# Patient Record
Sex: Female | Born: 1995 | Race: Black or African American | Hispanic: No | Marital: Single | Smoking: Never smoker
Health system: Southern US, Community
[De-identification: ages and names within clinical notes are randomized; demographics above are authoritative.]

## PROBLEM LIST (undated history)

## (undated) ENCOUNTER — Inpatient Hospital Stay (HOSPITAL_COMMUNITY): Payer: Self-pay

## (undated) DIAGNOSIS — K219 Gastro-esophageal reflux disease without esophagitis: Secondary | ICD-10-CM

## (undated) DIAGNOSIS — Z9109 Other allergy status, other than to drugs and biological substances: Secondary | ICD-10-CM

## (undated) DIAGNOSIS — K259 Gastric ulcer, unspecified as acute or chronic, without hemorrhage or perforation: Secondary | ICD-10-CM

## (undated) DIAGNOSIS — G43909 Migraine, unspecified, not intractable, without status migrainosus: Secondary | ICD-10-CM

## (undated) DIAGNOSIS — E860 Dehydration: Secondary | ICD-10-CM

## (undated) HISTORY — PX: NO PAST SURGERIES: SHX2092

---
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
## 2014-07-20 ENCOUNTER — Encounter (HOSPITAL_COMMUNITY): Payer: Self-pay | Admitting: Emergency Medicine

## 2014-07-20 ENCOUNTER — Emergency Department (HOSPITAL_COMMUNITY)
Admission: EM | Admit: 2014-07-20 | Discharge: 2014-07-20 | Disposition: A | Discharge status: Home / Self Care | Payer: Managed Care, Other (non HMO) | Attending: Emergency Medicine | Admitting: Emergency Medicine

## 2014-07-20 ENCOUNTER — Emergency Department (HOSPITAL_COMMUNITY): Payer: Managed Care, Other (non HMO)

## 2014-07-20 DIAGNOSIS — R1110 Vomiting, unspecified: Secondary | ICD-10-CM | POA: Insufficient documentation

## 2014-07-20 DIAGNOSIS — Z79899 Other long term (current) drug therapy: Secondary | ICD-10-CM | POA: Insufficient documentation

## 2014-07-20 DIAGNOSIS — R1013 Epigastric pain: Secondary | ICD-10-CM | POA: Diagnosis not present

## 2014-07-20 DIAGNOSIS — R109 Unspecified abdominal pain: Secondary | ICD-10-CM | POA: Diagnosis present

## 2014-07-20 DIAGNOSIS — Z3202 Encounter for pregnancy test, result negative: Secondary | ICD-10-CM | POA: Diagnosis not present

## 2014-07-20 DIAGNOSIS — N7011 Chronic salpingitis: Secondary | ICD-10-CM

## 2014-07-20 DIAGNOSIS — Z8709 Personal history of other diseases of the respiratory system: Secondary | ICD-10-CM | POA: Diagnosis not present

## 2014-07-20 LAB — COMPREHENSIVE METABOLIC PANEL
ALT: 12 U/L — AB (ref 14–54)
AST: 17 U/L (ref 15–41)
Albumin: 4.5 g/dL (ref 3.5–5.0)
Alkaline Phosphatase: 54 U/L (ref 38–126)
Anion gap: 8 (ref 5–15)
BUN: 10 mg/dL (ref 6–20)
CALCIUM: 9.2 mg/dL (ref 8.9–10.3)
CO2: 24 mmol/L (ref 22–32)
CREATININE: 0.54 mg/dL (ref 0.44–1.00)
Chloride: 106 mmol/L (ref 101–111)
GFR calc non Af Amer: >60 mL/min (ref >60)
Glucose, Bld: 121 mg/dL — ABNORMAL HIGH (ref 65–99)
POTASSIUM: 3.9 mmol/L (ref 3.5–5.1)
SODIUM: 138 mmol/L (ref 135–145)
Total Bilirubin: 0.8 mg/dL (ref 0.3–1.2)
Total Protein: 7.4 g/dL (ref 6.5–8.1)

## 2014-07-20 LAB — URINALYSIS, ROUTINE W REFLEX MICROSCOPIC
BILIRUBIN URINE: NEGATIVE
Glucose, UA: NEGATIVE mg/dL
HGB URINE DIPSTICK: NEGATIVE
Ketones, ur: NEGATIVE mg/dL
LEUKOCYTES UA: NEGATIVE
NITRITE: NEGATIVE
Protein, ur: NEGATIVE mg/dL
SPECIFIC GRAVITY, URINE: 1.023 (ref 1.005–1.030)
Urobilinogen, UA: 0.2 mg/dL (ref 0.0–1.0)
pH: 8 (ref 5.0–8.0)

## 2014-07-20 LAB — CBC WITH DIFFERENTIAL/PLATELET
Basophils Absolute: 0 10*3/uL (ref 0.0–0.1)
Basophils Relative: 0 % (ref 0–1)
Eosinophils Absolute: 0 10*3/uL (ref 0.0–0.7)
Eosinophils Relative: 0 % (ref 0–5)
HEMATOCRIT: 35.1 % — AB (ref 36.0–46.0)
Hemoglobin: 11.9 g/dL — ABNORMAL LOW (ref 12.0–15.0)
LYMPHS ABS: 1 10*3/uL (ref 0.7–4.0)
Lymphocytes Relative: 7 % — ABNORMAL LOW (ref 12–46)
MCH: 31.2 pg (ref 26.0–34.0)
MCHC: 33.9 g/dL (ref 30.0–36.0)
MCV: 92.1 fL (ref 78.0–100.0)
MONO ABS: 0.6 10*3/uL (ref 0.1–1.0)
Monocytes Relative: 4 % (ref 3–12)
Neutro Abs: 12.3 10*3/uL — ABNORMAL HIGH (ref 1.7–7.7)
Neutrophils Relative %: 89 % — ABNORMAL HIGH (ref 43–77)
PLATELETS: 240 10*3/uL (ref 150–400)
RBC: 3.81 MIL/uL — AB (ref 3.87–5.11)
RDW: 13.8 % (ref 11.5–15.5)
WBC: 13.9 10*3/uL — ABNORMAL HIGH (ref 4.0–10.5)

## 2014-07-20 LAB — LIPASE, BLOOD: Lipase: 14 U/L — ABNORMAL LOW (ref 22–51)

## 2014-07-20 LAB — PREGNANCY, URINE: Preg Test, Ur: NEGATIVE

## 2014-07-20 MED ORDER — HYDROCODONE-ACETAMINOPHEN 5-325 MG PO TABS: 1.0000 | ORAL_TABLET | Freq: Four times a day (QID) | ORAL | Status: DC | PRN

## 2014-07-20 MED ORDER — MORPHINE SULFATE 4 MG/ML IJ SOLN
4.0000 mg | Freq: Once | INTRAMUSCULAR | Status: AC
  Administered 2014-07-20: 4 mg via INTRAVENOUS
  Filled 2014-07-20: qty 1

## 2014-07-20 MED ORDER — IOHEXOL 300 MG/ML  SOLN
100.0000 mL | Freq: Once | INTRAMUSCULAR | Status: AC | PRN
  Administered 2014-07-20: 100 mL via INTRAVENOUS

## 2014-07-20 MED ORDER — IOHEXOL 300 MG/ML  SOLN
50.0000 mL | Freq: Once | INTRAMUSCULAR | Status: AC | PRN
  Administered 2014-07-20: 50 mL via ORAL

## 2014-07-20 MED ORDER — ONDANSETRON HCL 4 MG/2ML IJ SOLN
4.0000 mg | Freq: Once | INTRAMUSCULAR | Status: AC
  Administered 2014-07-20: 4 mg via INTRAVENOUS
  Filled 2014-07-20: qty 2

## 2014-07-20 MED ORDER — SODIUM CHLORIDE 0.9 % IV BOLUS (SEPSIS)
1000.0000 mL | Freq: Once | INTRAVENOUS | Status: AC
  Administered 2014-07-20: 1000 mL via INTRAVENOUS

## 2014-07-20 MED ORDER — ONDANSETRON 8 MG PO TBDP: ORAL_TABLET | ORAL | Status: DC

## 2014-07-20 NOTE — ED Notes (Signed)
Went in to patient room to remind patient that we need urine sample and to continue drinking the contrast for the CT.

## 2014-07-20 NOTE — ED Notes (Signed)
Pt c/o generalized abdominal pain and nausea/vomiting since this morning. Pt denies fever, diarrhea, urinary symptoms. A&Ox4 and ambulatory.

## 2014-07-20 NOTE — Discharge Instructions (Signed)
Hydrocodone as needed for pain. Zofran as needed for nausea.  Return to the emergency department if symptoms significantly worsen or change.   Abdominal Pain Many things can cause abdominal pain. Usually, abdominal pain is not caused by a disease and will improve without treatment. It can often be observed and treated at home. Your health care provider will do a physical exam and possibly order blood tests and X-rays to help determine the seriousness of your pain. However, in many cases, more time must pass before a clear cause of the pain can be found. Before that point, your health care provider may not know if you need more testing or further treatment. HOME CARE INSTRUCTIONS  Monitor your abdominal pain for any changes. The following actions may help to alleviate any discomfort you are experiencing:  Only take over-the-counter or prescription medicines as directed by your health care provider.  Do not take laxatives unless directed to do so by your health care provider.  Try a clear liquid diet (broth, tea, or water) as directed by your health care provider. Slowly move to a bland diet as tolerated. SEEK MEDICAL CARE IF:  You have unexplained abdominal pain.  You have abdominal pain associated with nausea or diarrhea.  You have pain when you urinate or have a bowel movement.  You experience abdominal pain that wakes you in the night.  You have abdominal pain that is worsened or improved by eating food.  You have abdominal pain that is worsened with eating fatty foods.  You have a fever. SEEK IMMEDIATE MEDICAL CARE IF:   Your pain does not go away within 2 hours.  You keep throwing up (vomiting).  Your pain is felt only in portions of the abdomen, such as the right side or the left lower portion of the abdomen.  You pass bloody or black tarry stools. MAKE SURE YOU:  Understand these instructions.   Will watch your condition.   Will get help right away if you are not  doing well or get worse.  Document Released: 11/10/2004 Document Revised: 02/05/2013 Document Reviewed: 10/10/2012 [facility] Patient Information 2015 [place], [place]. This information is not intended to replace advice given to you by your health care provider. Make sure you discuss any questions you have with your health care provider.

## 2014-07-20 NOTE — ED Provider Notes (Signed)
CSN: [id number]     Arrival date & time 07/20/14  1159 History   First MD Initiated Contact with Patient 07/20/14 1457     Chief Complaint  Patient presents with  . Emesis  . Abdominal Pain     (Consider location/radiation/quality/duration/timing/severity/associated sxs/prior Treatment) HPI Comments: Patient is a 19 year old female with no significant past medical history. She presents with complaints of upper abdominal pain, nausea, and vomiting that started early this a.m. She denies any bloody vomit or stool. She denies any diarrhea. She does report her last bowel movement was 2 hours ago and was normal. She denies any history of prior abdominal surgeries. Her last menstrual period was 3 weeks ago and was normal. She denies the possibility of pregnancy. She denies any urinary complaints.  Patient is a 19 y.o. female presenting with vomiting. The history is provided by the patient.  Emesis Severity:  Moderate Duration:  12 hours Timing:  Constant Recent urination:  Decreased Relieved by:  Nothing Worsened by:  Nothing tried Ineffective treatments:  None tried Associated symptoms: no chills and no fever     Past Medical History  Diagnosis Date  . Environmental allergies    History reviewed. No pertinent past surgical history. No family history on file. History  Substance Use Topics  . Smoking status: Never Smoker   . Smokeless tobacco: Not on file  . Alcohol Use: No   OB History    No data available     Review of Systems  Constitutional: Negative for chills.  Gastrointestinal: Positive for vomiting.  All other systems reviewed and are negative.     Allergies  Peanuts and Shrimp  Home Medications   Prior to Admission medications   Medication Sig Start Date End Date Taking? Authorizing Provider  cetirizine (ZYRTEC) 10 MG tablet Take 10 mg by mouth daily.   Yes Historical Provider, MD  loratadine (CLARITIN) 10 MG tablet Take 10 mg by mouth daily.   Yes Historical  Provider, MD  Multiple Vitamin (MULTIVITAMIN WITH MINERALS) TABS Take 1 tablet by mouth daily.   Yes Historical Provider, MD  fluticasone (FLONASE) 50 MCG/ACT nasal spray Place 2 sprays into the nose daily as needed for allergies.     Historical Provider, MD   BP 104/61 mmHg  Pulse 88  Temp(Src) 98.2 F (36.8 C) (Oral)  Resp 16  SpO2 100%  LMP 06/28/2014 Physical Exam  Constitutional: She is oriented to person, place, and time. She appears well-developed and well-nourished. No distress.  HENT:  Head: Normocephalic and atraumatic.  Neck: Normal range of motion. Neck supple.  Cardiovascular: Normal rate and regular rhythm.  Exam reveals no gallop and no friction rub.   No murmur heard. Pulmonary/Chest: Effort normal and breath sounds normal. No respiratory distress. She has no wheezes.  Abdominal: Soft. Bowel sounds are normal. She exhibits no distension. There is tenderness. There is no rebound and no guarding.  There is tenderness to palpation in the epigastric region.  Musculoskeletal: Normal range of motion.  Neurological: She is alert and oriented to person, place, and time.  Skin: Skin is warm and dry. She is not diaphoretic.  Nursing note and vitals reviewed.   ED Course  Procedures (including critical care time) Labs Review Labs Reviewed  CBC WITH DIFFERENTIAL/PLATELET - Abnormal; Notable for the following:    WBC 13.9 (*)    RBC 3.81 (*)    Hemoglobin 11.9 (*)    HCT 35.1 (*)    Neutrophils Relative % 89 (*)  Neutro Abs 12.3 (*)    Lymphocytes Relative 7 (*)    All other components within normal limits  COMPREHENSIVE METABOLIC PANEL - Abnormal; Notable for the following:    Glucose, Bld 121 (*)    ALT 12 (*)    All other components within normal limits  LIPASE, BLOOD - Abnormal; Notable for the following:    Lipase 14 (*)    All other components within normal limits  URINALYSIS, ROUTINE W REFLEX MICROSCOPIC (NOT AT [facility])  PREGNANCY, URINE    Imaging  Review No results found.   EKG Interpretation None      MDM   Final diagnoses:  None    Patient presents with severe upper abdominal pain and vomiting that started this morning. She is exquisitely tender in the epigastrium and writhing when she came in. For this reason a CT scan was obtained along with laboratory studies. She had an elevated white count, however normal LFTs and lipase. CT scan reveals no acute process but does reveal a hypo-pyosalpinx in the right adnexa. I've discussed this finding with Dr. Debroah Loop from OB/GYN who does not feel as though this is an acute process that requires intervention. I have mentioned this to the patient and told her to follow this up with her GYN. She does have an appointment this week to see them. She will be treated with pain medication, nausea medication, and when necessary return.    Geoffery Lyons, MD 07/20/14 [phone]

## 2014-10-25 ENCOUNTER — Encounter (HOSPITAL_COMMUNITY): Payer: Self-pay | Admitting: Emergency Medicine

## 2014-10-25 ENCOUNTER — Emergency Department (HOSPITAL_COMMUNITY)
Admission: EM | Admit: 2014-10-25 | Discharge: 2014-10-26 | Disposition: A | Discharge status: Home / Self Care | Payer: Managed Care, Other (non HMO) | Attending: Emergency Medicine | Admitting: Emergency Medicine

## 2014-10-25 DIAGNOSIS — R1013 Epigastric pain: Secondary | ICD-10-CM | POA: Diagnosis not present

## 2014-10-25 DIAGNOSIS — D649 Anemia, unspecified: Secondary | ICD-10-CM

## 2014-10-25 DIAGNOSIS — Z7951 Long term (current) use of inhaled steroids: Secondary | ICD-10-CM | POA: Diagnosis not present

## 2014-10-25 DIAGNOSIS — Z79899 Other long term (current) drug therapy: Secondary | ICD-10-CM | POA: Insufficient documentation

## 2014-10-25 LAB — COMPREHENSIVE METABOLIC PANEL
ALT: 16 U/L (ref 14–54)
AST: 22 U/L (ref 15–41)
Albumin: 4.4 g/dL (ref 3.5–5.0)
Alkaline Phosphatase: 54 U/L (ref 38–126)
Anion gap: 8 (ref 5–15)
BUN: 9 mg/dL (ref 6–20)
CHLORIDE: 106 mmol/L (ref 101–111)
CO2: 20 mmol/L — AB (ref 22–32)
CREATININE: 0.6 mg/dL (ref 0.44–1.00)
Calcium: 8.9 mg/dL (ref 8.9–10.3)
GFR calc Af Amer: >60 mL/min (ref >60)
GFR calc non Af Amer: >60 mL/min (ref >60)
Glucose, Bld: 117 mg/dL — ABNORMAL HIGH (ref 65–99)
Potassium: 3.7 mmol/L (ref 3.5–5.1)
Sodium: 134 mmol/L — ABNORMAL LOW (ref 135–145)
Total Bilirubin: 1.2 mg/dL (ref 0.3–1.2)
Total Protein: 7.2 g/dL (ref 6.5–8.1)

## 2014-10-25 LAB — CBC
HCT: 34.7 % — ABNORMAL LOW (ref 36.0–46.0)
HEMOGLOBIN: 11.4 g/dL — AB (ref 12.0–15.0)
MCH: 30.4 pg (ref 26.0–34.0)
MCHC: 32.9 g/dL (ref 30.0–36.0)
MCV: 92.5 fL (ref 78.0–100.0)
PLATELETS: 211 10*3/uL (ref 150–400)
RBC: 3.75 MIL/uL — AB (ref 3.87–5.11)
RDW: 13.1 % (ref 11.5–15.5)
WBC: 8.2 10*3/uL (ref 4.0–10.5)

## 2014-10-25 LAB — LIPASE, BLOOD: Lipase: 14 U/L — ABNORMAL LOW (ref 22–51)

## 2014-10-25 LAB — I-STAT BETA HCG BLOOD, ED (MC, WL, AP ONLY): I-stat hCG, quantitative: <5 m[IU]/mL (ref <5)

## 2014-10-25 MED ORDER — SODIUM CHLORIDE 0.9 % IV SOLN
1000.0000 mL | INTRAVENOUS | Status: DC
  Administered 2014-10-26: 1000 mL via INTRAVENOUS

## 2014-10-25 MED ORDER — GI COCKTAIL ~~LOC~~
30.0000 mL | Freq: Once | ORAL | Status: AC
  Administered 2014-10-26: 30 mL via ORAL
  Filled 2014-10-25: qty 30

## 2014-10-25 MED ORDER — ONDANSETRON 4 MG PO TBDP
4.0000 mg | ORAL_TABLET | Freq: Once | ORAL | Status: AC | PRN
  Administered 2014-10-25: 4 mg via ORAL
  Filled 2014-10-25: qty 1

## 2014-10-25 MED ORDER — ONDANSETRON HCL 4 MG/2ML IJ SOLN
4.0000 mg | Freq: Once | INTRAMUSCULAR | Status: AC
  Administered 2014-10-26: 4 mg via INTRAVENOUS
  Filled 2014-10-25: qty 2

## 2014-10-25 MED ORDER — PANTOPRAZOLE SODIUM 40 MG IV SOLR
40.0000 mg | Freq: Once | INTRAVENOUS | Status: AC
  Administered 2014-10-26: 40 mg via INTRAVENOUS
  Filled 2014-10-25: qty 40

## 2014-10-25 MED ORDER — SODIUM CHLORIDE 0.9 % IV SOLN
1000.0000 mL | Freq: Once | INTRAVENOUS | Status: AC
  Administered 2014-10-26: 1000 mL via INTRAVENOUS

## 2014-10-25 NOTE — ED Notes (Signed)
Pt dry heaving in triage.

## 2014-10-25 NOTE — ED Provider Notes (Signed)
CSN: [id number]     Arrival date & time 10/25/14  1916 History  This chart was scribed for Dione Booze, MD by Octavia Heir, ED Scribe. This patient was seen in room WA02/WA02 and the patient's care was started at 11:26 PM.    Chief Complaint  Patient presents with  . Abdominal Pain  . Emesis      The history is provided by the patient. No language interpreter was used.   HPI Comments: Nancy Vaughan is a 19 y.o. female who presents to the Emergency Department complaining of constant, severe, gradual worsening epigastric abdominal pain onset this morning. She rates her current pain an 8/10. Pt notes that touching her abdomen makes the pain worse and after she vomits, the pain alleviates minimally. Pt was seen in the ED about 3 months ago for the same symptoms. She reports drinking water and taking ibuprofen to alleviate the apin with no relief. Pt denies constipation, diarrhea, hematemesis, mucus in vomit, fever, chills, break out in sweats, and being around sick contacts. Pt is a non-smoker and does not consume etoh.   Past Medical History  Diagnosis Date  . Environmental allergies    History reviewed. No pertinent past surgical history. No family history on file. Social History  Substance Use Topics  . Smoking status: Never Smoker   . Smokeless tobacco: None  . Alcohol Use: No   OB History    No data available     Review of Systems  Constitutional: Negative for fever and chills.  Gastrointestinal: Positive for vomiting and abdominal pain. Negative for diarrhea.  All other systems reviewed and are negative.     Allergies  Peanuts and Shrimp  Home Medications   Prior to Admission medications   Medication Sig Start Date End Date Taking? Authorizing Provider  cetirizine (ZYRTEC) 10 MG tablet Take 10 mg by mouth daily.    Historical Provider, MD  fluticasone (FLONASE) 50 MCG/ACT nasal spray Place 2 sprays into the nose daily as needed for allergies.     Historical Provider,  MD  HYDROcodone-acetaminophen (NORCO) 5-325 MG per tablet Take 1-2 tablets by mouth every 6 (six) hours as needed. 07/20/14   Geoffery Lyons, MD  loratadine (CLARITIN) 10 MG tablet Take 10 mg by mouth daily.    Historical Provider, MD  Multiple Vitamin (MULTIVITAMIN WITH MINERALS) TABS Take 1 tablet by mouth daily.    Historical Provider, MD  ondansetron (ZOFRAN ODT) 8 MG disintegrating tablet  ODT q4 hours prn nausea 07/20/14   Geoffery Lyons, MD   Triage vitals: BP 97/58 mmHg  Pulse 81  Temp(Src) 98.4 F (36.9 C) (Oral)  Resp 18  SpO2 100%  LMP  Physical Exam  Constitutional: She is oriented to person, place, and time. She appears well-developed and well-nourished. No distress.  HENT:  Head: Normocephalic and atraumatic.  Eyes: EOM are normal. Pupils are equal, round, and reactive to light.  Neck: Normal range of motion. Neck supple. No JVD present.  Cardiovascular: Normal rate, regular rhythm and normal heart sounds.   No murmur heard. Pulmonary/Chest: Effort normal and breath sounds normal. She has no wheezes. She has no rales. She exhibits no tenderness.  Abdominal: Soft. She exhibits no distension and no mass. There is tenderness. There is no rebound and no guarding.  Moderate epigastric tenderness, bowel sounds decreased  Musculoskeletal: Normal range of motion. She exhibits no edema.  Lymphadenopathy:    She has no cervical adenopathy.  Neurological: She is alert and oriented to person,  place, and time. No cranial nerve deficit. She exhibits normal muscle tone. Coordination normal.  Skin: Skin is warm and dry. No rash noted.  Psychiatric: She has a normal mood and affect. Her behavior is normal. Judgment and thought content normal.  Nursing note and vitals reviewed.   ED Course  Procedures  DIAGNOSTIC STUDIES: Oxygen Saturation is 100% on RA, normal by my interpretation.  COORDINATION OF CARE:  11:29 PM Discussed treatment plan which includes Zofran, Protonix, and GI  cocktail with pt at bedside and pt agreed to plan.  Labs Review Results for orders placed or performed during the hospital encounter of 10/25/14  Lipase, blood  Result Value Ref Range   Lipase 14 (L) 22 - 51 U/L  Comprehensive metabolic panel  Result Value Ref Range   Sodium 134 (L) 135 - 145 mmol/L   Potassium 3.7 3.5 - 5.1 mmol/L   Chloride 106 101 - 111 mmol/L   CO2 20 (L) 22 - 32 mmol/L   Glucose, Bld 117 (H) 65 - 99 mg/dL   BUN 9 6 - 20 mg/dL   Creatinine, Ser 1.61 0.44 - 1.00 mg/dL   Calcium 8.9 8.9 - 09.6 mg/dL   Total Protein 7.2 6.5 - 8.1 g/dL   Albumin 4.4 3.5 - 5.0 g/dL   AST 22 15 - 41 U/L   ALT 16 14 - 54 U/L   Alkaline Phosphatase 54 38 - 126 U/L   Total Bilirubin 1.2 0.3 - 1.2 mg/dL   GFR calc non Af Amer >60 >60 mL/min   GFR calc Af Amer >60 >60 mL/min   Anion gap 8 5 - 15  CBC  Result Value Ref Range   WBC 8.2 4.0 - 10.5 K/uL   RBC 3.75 (L) 3.87 - 5.11 MIL/uL   Hemoglobin 11.4 (L) 12.0 - 15.0 g/dL   HCT 04.5 (L) 40.9 - 81.1 %   MCV 92.5 78.0 - 100.0 fL   MCH 30.4 26.0 - 34.0 pg   MCHC 32.9 30.0 - 36.0 g/dL   RDW 91.4 78.2 - 95.6 %   Platelets 211 150 - 400 K/uL  Urinalysis, Routine w reflex microscopic (not at [facility])  Result Value Ref Range   Color, Urine YELLOW YELLOW   APPearance TURBID (A) CLEAR   Specific Gravity, Urine 1.028 1.005 - 1.030   pH 8.5 (H) 5.0 - 8.0   Glucose, UA NEGATIVE NEGATIVE mg/dL   Hgb urine dipstick NEGATIVE NEGATIVE   Bilirubin Urine NEGATIVE NEGATIVE   Ketones, ur 15 (A) NEGATIVE mg/dL   Protein, ur 30 (A) NEGATIVE mg/dL   Urobilinogen, UA 0.2 0.0 - 1.0 mg/dL   Nitrite NEGATIVE NEGATIVE   Leukocytes, UA NEGATIVE NEGATIVE  Urine microscopic-add on  Result Value Ref Range   Squamous Epithelial / LPF RARE RARE   Bacteria, UA FEW (A) RARE   Urine-Other AMORPHOUS URATES/PHOSPHATES   I-Stat beta hCG blood, ED ([facility], [facility], [facility] only)  Result Value Ref Range   I-stat hCG, quantitative <5.0 <5 mIU/mL   Comment 3           I have  personally reviewed and evaluated these lab results as part of my medical decision-making.   MDM   Final diagnoses:  Epigastric pain  Normochromic normocytic anemia    Epigastric pain worrisome for possible ulcer disease. Nausea had been controlled with ondansetron. She is given IV fluids, IV pantoprazole, and he had GI cocktail. She had excellent relief of symptoms with this. Old records were reviewed  and she had an ED visit in June with similar symptoms. CT at that time showed probable hydro-pyosalpinx but was asymptomatic and she was instructed to follow-up with OB/GYN. She is discharged with prescription for pantoprazole and also for ondansetron. She is also noted to have slightly elevated blood sugar. She is advised to have this checked periodically since it may represent prediabetes.  I personally performed the services described in this documentation, which was scribed in my presence. The recorded information has been reviewed and is accurate.     Dione Booze, MD 10/26/14 [phone]

## 2014-10-25 NOTE — ED Notes (Signed)
Pt reports abdominal pain since this a.m and vomiting that started about 30 minutes ago.  Pt nauseated in triage.

## 2014-10-25 NOTE — ED Notes (Signed)
Requested patient to urinate. 

## 2014-10-26 DIAGNOSIS — R1013 Epigastric pain: Secondary | ICD-10-CM | POA: Diagnosis not present

## 2014-10-26 LAB — URINALYSIS, ROUTINE W REFLEX MICROSCOPIC
BILIRUBIN URINE: NEGATIVE
Glucose, UA: NEGATIVE mg/dL
Hgb urine dipstick: NEGATIVE
Ketones, ur: 15 mg/dL — AB
LEUKOCYTES UA: NEGATIVE
NITRITE: NEGATIVE
PROTEIN: 30 mg/dL — AB
Specific Gravity, Urine: 1.028 (ref 1.005–1.030)
UROBILINOGEN UA: 0.2 mg/dL (ref 0.0–1.0)
pH: 8.5 — ABNORMAL HIGH (ref 5.0–8.0)

## 2014-10-26 LAB — URINE MICROSCOPIC-ADD ON

## 2014-10-26 MED ORDER — ONDANSETRON HCL 4 MG PO TABS: 4.0000 mg | ORAL_TABLET | Freq: Four times a day (QID) | ORAL | Status: DC | PRN

## 2014-10-26 MED ORDER — PANTOPRAZOLE SODIUM 40 MG PO TBEC: 40.0000 mg | DELAYED_RELEASE_TABLET | Freq: Every day | ORAL | Status: DC

## 2014-10-26 NOTE — Discharge Instructions (Signed)
I am concerned that your pain may be from an ulcer. Please follow-up with your doctor for further evaluation.  Your blood sugar is slightly elevated. This may be what is called prediabetes. This means that your blood sugar should be checked periodically. You do not need any special treatment are diet at this point. However, if your blood sugar starts getting higher, he may need medication for it.   Abdominal Pain Many things can cause abdominal pain. Usually, abdominal pain is not caused by a disease and will improve without treatment. It can often be observed and treated at home. Your health care provider will do a physical exam and possibly order blood tests and X-rays to help determine the seriousness of your pain. However, in many cases, more time must pass before a clear cause of the pain can be found. Before that point, your health care provider may not know if you need more testing or further treatment. HOME CARE INSTRUCTIONS  Monitor your abdominal pain for any changes. The following actions may help to alleviate any discomfort you are experiencing:  Only take over-the-counter or prescription medicines as directed by your health care provider.  Do not take laxatives unless directed to do so by your health care provider.  Try a clear liquid diet (broth, tea, or water) as directed by your health care provider. Slowly move to a bland diet as tolerated. SEEK MEDICAL CARE IF:  You have unexplained abdominal pain.  You have abdominal pain associated with nausea or diarrhea.  You have pain when you urinate or have a bowel movement.  You experience abdominal pain that wakes you in the night.  You have abdominal pain that is worsened or improved by eating food.  You have abdominal pain that is worsened with eating fatty foods.  You have a fever. SEEK IMMEDIATE MEDICAL CARE IF:   Your pain does not go away within 2 hours.  You keep throwing up (vomiting).  Your pain is felt only in  portions of the abdomen, such as the right side or the left lower portion of the abdomen.  You pass bloody or black tarry stools. MAKE SURE YOU:  Understand these instructions.   Will watch your condition.   Will get help right away if you are not doing well or get worse.  Document Released: 11/10/2004 Document Revised: 02/05/2013 Document Reviewed: 10/10/2012 [facility] Patient Information 2015 [place], [place]. This information is not intended to replace advice given to you by your health care provider. Make sure you discuss any questions you have with your health care provider.  Peptic Ulcer A peptic ulcer is a sore in the lining of your esophagus (esophageal ulcer), stomach (gastric ulcer), or in the first part of your small intestine (duodenal ulcer). The ulcer causes erosion into the deeper tissue. CAUSES  Normally, the lining of the stomach and the small intestine protects itself from the acid that digests food. The protective lining can be damaged by:  An infection caused by a bacterium called Helicobacter pylori (H. pylori).  Regular use of nonsteroidal anti-inflammatory drugs (NSAIDs), such as ibuprofen or aspirin.  Smoking tobacco. Other risk factors include being older than 50, drinking alcohol excessively, and having a family history of ulcer disease.  SYMPTOMS   Burning pain or gnawing in the area between the chest and the belly button.  Heartburn.  Nausea and vomiting.  Bloating. The pain can be worse on an empty stomach and at night. If the ulcer results in bleeding, it can  cause:  Black, tarry stools.  Vomiting of bright red blood.  Vomiting of coffee-ground-looking materials. DIAGNOSIS  A diagnosis is usually made based upon your history and an exam. Other tests and procedures may be performed to find the cause of the ulcer. Finding a cause will help determine the best treatment. Tests and procedures may include:  Blood tests, stool tests, or breath tests  to check for the bacterium H. pylori.  An upper gastrointestinal (GI) series of the esophagus, stomach, and small intestine.  An endoscopy to examine the esophagus, stomach, and small intestine.  A biopsy. TREATMENT  Treatment may include:  Eliminating the cause of the ulcer, such as smoking, NSAIDs, or alcohol.  Medicines to reduce the amount of acid in your digestive tract.  Antibiotic medicines if the ulcer is caused by the H. pylori bacterium.  An upper endoscopy to treat a bleeding ulcer.  Surgery if the bleeding is severe or if the ulcer created a hole somewhere in the digestive system. HOME CARE INSTRUCTIONS   Avoid tobacco, alcohol, and caffeine. Smoking can increase the acid in the stomach, and continued smoking will impair the healing of ulcers.  Avoid foods and drinks that seem to cause discomfort or aggravate your ulcer.  Only take medicines as directed by your caregiver. Do not substitute over-the-counter medicines for prescription medicines without talking to your caregiver.  Keep any follow-up appointments and tests as directed. SEEK MEDICAL CARE IF:   Your do not improve within 7 days of starting treatment.  You have ongoing indigestion or heartburn. SEEK IMMEDIATE MEDICAL CARE IF:   You have sudden, sharp, or persistent abdominal pain.  You have bloody or dark black, tarry stools.  You vomit blood or vomit that looks like coffee grounds.  You become light-headed, weak, or feel faint.  You become sweaty or clammy. MAKE SURE YOU:   Understand these instructions.  Will watch your condition.  Will get help right away if you are not doing well or get worse. Document Released: 01/29/2000 Document Revised: 06/17/2013 Document Reviewed: 08/31/2011 [facility] Patient Information 2015 [place], [place]. This information is not intended to replace advice given to you by your health care provider. Make sure you discuss any questions you have with your health care  provider.  Pantoprazole tablets What is this medicine? PANTOPRAZOLE (pan TOE pra zole) prevents the production of acid in the stomach. It is used to treat gastroesophageal reflux disease (GERD), inflammation of the esophagus, and Zollinger-Ellison syndrome. This medicine may be used for other purposes; ask your health care provider or pharmacist if you have questions. COMMON BRAND NAME(S): Protonix What should I tell my health care provider before I take this medicine? They need to know if you have any of these conditions: -liver disease -low levels of magnesium in the blood -an unusual or allergic reaction to omeprazole, lansoprazole, pantoprazole, rabeprazole, other medicines, foods, dyes, or preservatives -pregnant or trying to get pregnant -breast-feeding How should I use this medicine? Take this medicine by mouth. Swallow the tablets whole with a drink of water. Follow the directions on the prescription label. Do not crush, break, or chew. Take your medicine at regular intervals. Do not take your medicine more often than directed. Talk to your pediatrician regarding the use of this medicine in children. While this drug may be prescribed for children as young as 5 years for selected conditions, precautions do apply. Overdosage: If you think you have taken too much of this medicine contact a poison control center  or emergency room at once. NOTE: This medicine is only for you. Do not share this medicine with others. What if I miss a dose? If you miss a dose, take it as soon as you can. If it is almost time for your next dose, take only that dose. Do not take double or extra doses. What may interact with this medicine? Do not take this medicine with any of the following medications: -atazanavir -nelfinavir This medicine may also interact with the following medications: -ampicillin -delavirdine -digoxin -diuretics -iron salts -medicines for fungal infections like ketoconazole,  itraconazole and voriconazole -warfarin This list may not describe all possible interactions. Give your health care provider a list of all the medicines, herbs, non-prescription drugs, or dietary supplements you use. Also tell them if you smoke, drink alcohol, or use illegal drugs. Some items may interact with your medicine. What should I watch for while using this medicine? It can take several days before your stomach pain gets better. Check with your doctor or health care professional if your condition does not start to get better, or if it gets worse. You may need blood work done while you are taking this medicine. What side effects may I notice from receiving this medicine? Side effects that you should report to your doctor or health care professional as soon as possible: -allergic reactions like skin rash, itching or hives, swelling of the face, lips, or tongue -bone, muscle or joint pain -breathing problems -chest pain or chest tightness -dark yellow or brown urine -dizziness -fast, irregular heartbeat -feeling faint or lightheaded -fever or sore throat -muscle spasm -palpitations -redness, blistering, peeling or loosening of the skin, including inside the mouth -seizures -tremors -unusual bleeding or bruising -unusually weak or tired -yellowing of the eyes or skin Side effects that usually do not require medical attention (Report these to your doctor or health care professional if they continue or are bothersome.): -constipation -diarrhea -dry mouth -headache -nausea This list may not describe all possible side effects. Call your doctor for medical advice about side effects. You may report side effects to FDA at 1-800-FDA-1088. Where should I keep my medicine? Keep out of the reach of children. Store at room temperature between 15 and 30 degrees C (59 and 86 degrees F). Protect from light and moisture. Throw away any unused medicine after the expiration date. NOTE: This  sheet is a summary. It may not cover all possible information. If you have questions about this medicine, talk to your doctor, pharmacist, or health care provider.  2015, [employer]. (2011-11-30 16:40:16)  Ondansetron tablets What is this medicine? ONDANSETRON (on DAN se tron) is used to treat nausea and vomiting caused by chemotherapy. It is also used to prevent or treat nausea and vomiting after surgery. This medicine may be used for other purposes; ask your health care provider or pharmacist if you have questions. COMMON BRAND NAME(S): Zofran What should I tell my health care provider before I take this medicine? They need to know if you have any of these conditions: -heart disease -history of irregular heartbeat -liver disease -low levels of magnesium or potassium in the blood -an unusual or allergic reaction to ondansetron, granisetron, other medicines, foods, dyes, or preservatives -pregnant or trying to get pregnant -breast-feeding How should I use this medicine? Take this medicine by mouth with a glass of water. Follow the directions on your prescription label. Take your doses at regular intervals. Do not take your medicine more often than directed. Talk to your  pediatrician regarding the use of this medicine in children. Special care may be needed. Overdosage: If you think you have taken too much of this medicine contact a poison control center or emergency room at once. NOTE: This medicine is only for you. Do not share this medicine with others. What if I miss a dose? If you miss a dose, take it as soon as you can. If it is almost time for your next dose, take only that dose. Do not take double or extra doses. What may interact with this medicine? Do not take this medicine with any of the following medications: -apomorphine -certain medicines for fungal infections like fluconazole, itraconazole, ketoconazole, posaconazole,  voriconazole -cisapride -dofetilide -dronedarone -pimozide -thioridazine -ziprasidone This medicine may also interact with the following medications: -carbamazepine -certain medicines for depression, anxiety, or psychotic disturbances -fentanyl -linezolid -MAOIs like Carbex, Eldepryl, Marplan, Nardil, and Parnate -methylene blue (injected into a vein) -other medicines that prolong the QT interval (cause an abnormal heart rhythm) -phenytoin -rifampicin -tramadol This list may not describe all possible interactions. Give your health care provider a list of all the medicines, herbs, non-prescription drugs, or dietary supplements you use. Also tell them if you smoke, drink alcohol, or use illegal drugs. Some items may interact with your medicine. What should I watch for while using this medicine? Check with your doctor or health care professional right away if you have any sign of an allergic reaction. What side effects may I notice from receiving this medicine? Side effects that you should report to your doctor or health care professional as soon as possible: -allergic reactions like skin rash, itching or hives, swelling of the face, lips or tongue -breathing problems -confusion -dizziness -fast or irregular heartbeat -feeling faint or lightheaded, falls -fever and chills -loss of balance or coordination -seizures -sweating -swelling of the hands or feet -tightness in the chest -tremors -unusually weak or tired Side effects that usually do not require medical attention (report to your doctor or health care professional if they continue or are bothersome): -constipation or diarrhea -headache This list may not describe all possible side effects. Call your doctor for medical advice about side effects. You may report side effects to FDA at 1-800-FDA-1088. Where should I keep my medicine? Keep out of the reach of children. Store between 2 and 30 degrees C (36 and 86 degrees F).  Throw away any unused medicine after the expiration date. NOTE: This sheet is a summary. It may not cover all possible information. If you have questions about this medicine, talk to your doctor, pharmacist, or health care provider.  2015, [employer] Standard. (2012-11-07 16:27:45)

## 2015-04-08 ENCOUNTER — Ambulatory Visit
Admission: RE | Admit: 2015-04-08 | Discharge: 2015-04-08 | Disposition: A | Discharge status: Home / Self Care | Payer: Managed Care, Other (non HMO) | Source: Ambulatory Visit | Attending: Cardiovascular Disease | Admitting: Cardiovascular Disease

## 2015-04-08 ENCOUNTER — Other Ambulatory Visit: Payer: Self-pay | Admitting: Cardiovascular Disease

## 2015-04-08 DIAGNOSIS — M25572 Pain in left ankle and joints of left foot: Secondary | ICD-10-CM

## 2016-01-28 ENCOUNTER — Emergency Department (HOSPITAL_COMMUNITY)
Admission: EM | Admit: 2016-01-28 | Discharge: 2016-01-28 | Disposition: A | Discharge status: Home / Self Care | Payer: Managed Care, Other (non HMO) | Source: Home / Self Care

## 2016-01-28 ENCOUNTER — Encounter (HOSPITAL_COMMUNITY): Payer: Self-pay | Admitting: General Practice

## 2016-01-28 ENCOUNTER — Observation Stay (HOSPITAL_COMMUNITY): Payer: Managed Care, Other (non HMO)

## 2016-01-28 ENCOUNTER — Observation Stay (HOSPITAL_COMMUNITY)
Admission: AD | Admit: 2016-01-28 | Discharge: 2016-01-31 | Disposition: A | Discharge status: Home / Self Care | Payer: Managed Care, Other (non HMO) | Source: Ambulatory Visit | Attending: Cardiovascular Disease | Admitting: Cardiovascular Disease

## 2016-01-28 DIAGNOSIS — R1013 Epigastric pain: Secondary | ICD-10-CM | POA: Diagnosis present

## 2016-01-28 DIAGNOSIS — R109 Unspecified abdominal pain: Secondary | ICD-10-CM | POA: Insufficient documentation

## 2016-01-28 DIAGNOSIS — Z91013 Allergy to seafood: Secondary | ICD-10-CM | POA: Diagnosis not present

## 2016-01-28 DIAGNOSIS — K279 Peptic ulcer, site unspecified, unspecified as acute or chronic, without hemorrhage or perforation: Secondary | ICD-10-CM | POA: Insufficient documentation

## 2016-01-28 DIAGNOSIS — Z5321 Procedure and treatment not carried out due to patient leaving prior to being seen by health care provider: Secondary | ICD-10-CM

## 2016-01-28 DIAGNOSIS — G43A0 Cyclical vomiting, not intractable: Secondary | ICD-10-CM | POA: Insufficient documentation

## 2016-01-28 DIAGNOSIS — E860 Dehydration: Principal | ICD-10-CM

## 2016-01-28 DIAGNOSIS — K208 Other esophagitis: Secondary | ICD-10-CM | POA: Insufficient documentation

## 2016-01-28 DIAGNOSIS — K2950 Unspecified chronic gastritis without bleeding: Secondary | ICD-10-CM | POA: Diagnosis not present

## 2016-01-28 DIAGNOSIS — Z8719 Personal history of other diseases of the digestive system: Secondary | ICD-10-CM | POA: Diagnosis not present

## 2016-01-28 DIAGNOSIS — Z91018 Allergy to other foods: Secondary | ICD-10-CM | POA: Insufficient documentation

## 2016-01-28 DIAGNOSIS — K219 Gastro-esophageal reflux disease without esophagitis: Secondary | ICD-10-CM | POA: Insufficient documentation

## 2016-01-28 DIAGNOSIS — Z91010 Allergy to peanuts: Secondary | ICD-10-CM | POA: Insufficient documentation

## 2016-01-28 DIAGNOSIS — Z888 Allergy status to other drugs, medicaments and biological substances status: Secondary | ICD-10-CM | POA: Insufficient documentation

## 2016-01-28 DIAGNOSIS — F419 Anxiety disorder, unspecified: Secondary | ICD-10-CM | POA: Insufficient documentation

## 2016-01-28 DIAGNOSIS — R1115 Cyclical vomiting syndrome unrelated to migraine: Secondary | ICD-10-CM

## 2016-01-28 DIAGNOSIS — R1012 Left upper quadrant pain: Secondary | ICD-10-CM

## 2016-01-28 HISTORY — DX: Migraine, unspecified, not intractable, without status migrainosus: G43.909

## 2016-01-28 HISTORY — DX: Gastro-esophageal reflux disease without esophagitis: K21.9

## 2016-01-28 HISTORY — DX: Gastric ulcer, unspecified as acute or chronic, without hemorrhage or perforation: K25.9

## 2016-01-28 HISTORY — DX: Dehydration: E86.0

## 2016-01-28 LAB — CBC WITH DIFFERENTIAL/PLATELET
BASOS ABS: 0 10*3/uL (ref 0.0–0.1)
Basophils Relative: 0 %
Eosinophils Absolute: 0 10*3/uL (ref 0.0–0.7)
Eosinophils Relative: 0 %
HEMATOCRIT: 34.3 % — AB (ref 36.0–46.0)
Hemoglobin: 11.9 g/dL — ABNORMAL LOW (ref 12.0–15.0)
LYMPHS ABS: 0.7 10*3/uL (ref 0.7–4.0)
LYMPHS PCT: 8 %
MCH: 31.6 pg (ref 26.0–34.0)
MCHC: 34.7 g/dL (ref 30.0–36.0)
MCV: 91.2 fL (ref 78.0–100.0)
Monocytes Absolute: 0.4 10*3/uL (ref 0.1–1.0)
Monocytes Relative: 5 %
NEUTROS ABS: 7.7 10*3/uL (ref 1.7–7.7)
Neutrophils Relative %: 87 %
Platelets: 203 10*3/uL (ref 150–400)
RBC: 3.76 MIL/uL — AB (ref 3.87–5.11)
RDW: 13.3 % (ref 11.5–15.5)
WBC: 8.9 10*3/uL (ref 4.0–10.5)

## 2016-01-28 LAB — COMPREHENSIVE METABOLIC PANEL
ALBUMIN: 4.1 g/dL (ref 3.5–5.0)
ALT: 13 U/L — ABNORMAL LOW (ref 14–54)
ANION GAP: 8 (ref 5–15)
AST: 20 U/L (ref 15–41)
Alkaline Phosphatase: 48 U/L (ref 38–126)
BILIRUBIN TOTAL: 1 mg/dL (ref 0.3–1.2)
BUN: 8 mg/dL (ref 6–20)
CHLORIDE: 109 mmol/L (ref 101–111)
CO2: 20 mmol/L — ABNORMAL LOW (ref 22–32)
Calcium: 9.1 mg/dL (ref 8.9–10.3)
Creatinine, Ser: 0.76 mg/dL (ref 0.44–1.00)
GFR calc Af Amer: >60 mL/min (ref >60)
GLUCOSE: 94 mg/dL (ref 65–99)
POTASSIUM: 3.7 mmol/L (ref 3.5–5.1)
Sodium: 137 mmol/L (ref 135–145)
TOTAL PROTEIN: 6.8 g/dL (ref 6.5–8.1)

## 2016-01-28 LAB — LIPASE, BLOOD: Lipase: 15 U/L (ref 11–51)

## 2016-01-28 LAB — AMYLASE: Amylase: 59 U/L (ref 28–100)

## 2016-01-28 LAB — PREGNANCY, URINE: PREG TEST UR: NEGATIVE

## 2016-01-28 MED ORDER — ENOXAPARIN SODIUM 40 MG/0.4ML ~~LOC~~ SOLN
40.0000 mg | SUBCUTANEOUS | Status: DC
  Filled 2016-01-28 (×2): qty 0.4

## 2016-01-28 MED ORDER — DEXTROSE-NACL 5-0.9 % IV SOLN
INTRAVENOUS | Status: DC
  Administered 2016-01-28 – 2016-01-31 (×6): via INTRAVENOUS

## 2016-01-28 MED ORDER — ONDANSETRON HCL 4 MG/2ML IJ SOLN
4.0000 mg | Freq: Once | INTRAMUSCULAR | Status: AC
  Administered 2016-01-28: 4 mg via INTRAVENOUS
  Filled 2016-01-28: qty 2

## 2016-01-28 MED ORDER — HYDROMORPHONE HCL 2 MG/ML IJ SOLN
0.5000 mg | Freq: Once | INTRAMUSCULAR | Status: AC
  Administered 2016-01-28: 0.5 mg via INTRAVENOUS
  Filled 2016-01-28: qty 1

## 2016-01-28 MED ORDER — ONDANSETRON HCL 4 MG/2ML IJ SOLN
4.0000 mg | Freq: Four times a day (QID) | INTRAMUSCULAR | Status: DC | PRN
  Administered 2016-01-29 (×2): 4 mg via INTRAVENOUS
  Filled 2016-01-28 (×2): qty 2

## 2016-01-28 MED ORDER — PANTOPRAZOLE SODIUM 40 MG IV SOLR
40.0000 mg | INTRAVENOUS | Status: DC
  Administered 2016-01-28 – 2016-01-30 (×3): 40 mg via INTRAVENOUS
  Filled 2016-01-28 (×3): qty 40

## 2016-01-28 NOTE — ED Triage Notes (Signed)
Called for triage x 3 all parts of lobby.

## 2016-01-28 NOTE — H&P (Signed)
Referring Physician:  Oneal GroutJade Vaughan is an 20 y.o. female.                       Chief Complaint: Nausea, vomiting and weakness with abdominal pain.  HPI: 20 year old female has 2 day history of abdominal pain, nausea, vomiting and weakness. She is unable to keep any medication or food down and unable to stand up without support. She has history of gastric ulcer and has flare up of gastritis off and on.   Past Medical History:  Diagnosis Date  . Environmental allergies       No past surgical history on file.  No family history on file. Social History:  reports that she has never smoked. She does not have any smokeless tobacco history on file. She reports that she does not drink alcohol or use drugs.  Allergies:  Allergies  Allergen Reactions  . Peanuts [Peanut Oil] Itching and Other (See Comments)    Itchy throat and sneezing   . Shrimp [Shellfish Allergy] Swelling and Other (See Comments)    Congestion.    Medications Prior to Admission  Medication Sig Dispense Refill  . EPIPEN 2-PAK 0.3 MG/0.3ML SOAJ injection Inject 1 application into the skin as needed (for allergic reaciton).   1  . fluticasone (FLONASE) 50 MCG/ACT nasal spray Place 2 sprays into the nose daily as needed for allergies.     Marland Kitchen. ibuprofen (ADVIL,MOTRIN) 200 MG tablet Take 400 mg by mouth every 6 (six) hours as needed for fever, headache, mild pain, moderate pain or cramping.    . loratadine (CLARITIN) 10 MG tablet Take 10 mg by mouth daily.    . ondansetron (ZOFRAN) 4 MG tablet Take 1 tablet (4 mg total) by mouth every 6 (six) hours as needed for nausea or vomiting. 12 tablet 0  . pantoprazole (PROTONIX) 40 MG tablet Take 1 tablet (40 mg total) by mouth daily. 30 tablet 0    No results found for this or any previous visit (from the past 48 hour(Vaughan)). No results found.  Review Of Systems Constitutional: No fever, chills , weight loss or gain. Eyes: No vision change, No discharge or pain.. Ears: No hearing  loss, No tinnitus. Respiratory: No asthma, COPD, pneumonias or shortness of breath. No hemoptysis. Cardiovascular: No chest pain, palpitation or leg edema. Gastrointestinal: Positive nausea, vomiting. No diarrhea or constipation. No GI bleed. No hepatitis. Positive gastritis. Genitourinary: No dysuria, hematuria or kidney stone. No incontinance. Neurological: Occasional headache, no stroke or seizures.  Psychiatry: No psych facility admission for anxiety, depression or suicide. No detox. Skin: No rash. Musculoskeletal: No joint pain or fibromyalgia. No neck pain or back pain. Lymphadenopathy: No lymphadenopathy. Hematology: No anemia or easy bruising.   Blood pressure (!) 91/59, pulse 96, temperature 98.3 F (36.8 C), temperature source Oral, resp. rate 16, height 5\' 3"  (1.6 m), weight 50.1 kg (110 lb 7.2 oz), SpO2 90 %. Body mass index is 19.57 kg/m. General appearance: alert, cooperative, appears stated age and moderate distress Head: Normocephalic, atraumatic. Eyes: conjunctivae/corneas clear. PERRL, EOM'Vaughan intact.  Neck: No adenopathy, no carotid bruit, no JVD, supple, symmetrical, trachea midline and thyroid not enlarged, symmetric, no tenderness/mass/nodules Resp: Clear to auscultation bilaterally. Cardio: Regular rate and rhythm, S1, S2 normal, II/VI systolic murmur, no click, rub or gallop GI: soft, epigastic-tenderness; bowel sounds normal; no masses,  no organomegaly. Extremities: extremities normal, atraumatic, no cyanosis or edema Skin: Warm and dry. No rashes or lesions Neurologic: Alert  and oriented X 3, normal strength and tone.   Assessment/Plan Acute dehydration Acute abdominal pain H/O Gastirits R/O pancreatitis  Place in observation IV fluids/blood work.  Nancy RodriguezKADAKIA,Nancy Dower S, MD  01/28/2016, 6:07 PM

## 2016-01-28 NOTE — ED Notes (Signed)
Patient in lobby states this patient received a phone call from primary care doctor stating that they could see patient shortly and left.

## 2016-01-29 DIAGNOSIS — R1012 Left upper quadrant pain: Secondary | ICD-10-CM | POA: Diagnosis not present

## 2016-01-29 DIAGNOSIS — E860 Dehydration: Secondary | ICD-10-CM

## 2016-01-29 LAB — CBC
HEMATOCRIT: 33.7 % — AB (ref 36.0–46.0)
HEMOGLOBIN: 11.1 g/dL — AB (ref 12.0–15.0)
MCH: 30 pg (ref 26.0–34.0)
MCHC: 32.9 g/dL (ref 30.0–36.0)
MCV: 91.1 fL (ref 78.0–100.0)
Platelets: 206 10*3/uL (ref 150–400)
RBC: 3.7 MIL/uL — ABNORMAL LOW (ref 3.87–5.11)
RDW: 13.4 % (ref 11.5–15.5)
WBC: 12 10*3/uL — AB (ref 4.0–10.5)

## 2016-01-29 LAB — BASIC METABOLIC PANEL
ANION GAP: 6 (ref 5–15)
BUN: 7 mg/dL (ref 6–20)
CALCIUM: 8.6 mg/dL — AB (ref 8.9–10.3)
CO2: 24 mmol/L (ref 22–32)
Chloride: 108 mmol/L (ref 101–111)
Creatinine, Ser: 0.75 mg/dL (ref 0.44–1.00)
GFR calc Af Amer: >60 mL/min (ref >60)
GLUCOSE: 99 mg/dL (ref 65–99)
POTASSIUM: 3.6 mmol/L (ref 3.5–5.1)
SODIUM: 138 mmol/L (ref 135–145)

## 2016-01-29 MED ORDER — DIPHENHYDRAMINE HCL 25 MG PO CAPS
25.0000 mg | ORAL_CAPSULE | Freq: Once | ORAL | Status: AC
  Administered 2016-01-29: 25 mg via ORAL
  Filled 2016-01-29: qty 1

## 2016-01-29 MED ORDER — HYDROMORPHONE HCL 2 MG/ML IJ SOLN
0.5000 mg | Freq: Once | INTRAMUSCULAR | Status: AC
  Administered 2016-01-29: 0.5 mg via INTRAVENOUS
  Filled 2016-01-29: qty 1

## 2016-01-29 MED ORDER — OXYCODONE-ACETAMINOPHEN 5-325 MG PO TABS
1.0000 | ORAL_TABLET | Freq: Four times a day (QID) | ORAL | Status: DC | PRN
  Administered 2016-01-29 – 2016-01-31 (×5): 1 via ORAL
  Filled 2016-01-29 (×5): qty 1

## 2016-01-29 NOTE — Consult Note (Signed)
[facility] Consult: 9:10 AM 01/29/2016  LOS: 0 days    Referring Provider:  Dr Algie Coffer  Primary Care Physician:  Ricki Rodriguez, MD Primary Gastroenterologist:  Gentry Fitz.     Reason for Consultation:  Abdominal pain.    HPI: Nancy Vaughan is a 20 y.o. female.  PMH Migraines.  2 visits to ED in 07/2014 and 10/2014 with similar to current sxs of upper abdominal nonradiating pain, nausea and vomiting. 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  She was given pain meds and antinauseal. At the second visit she was treated with hydrocodone, anti-emetics and pantoprazole.  She took the pantoprazole for about a year.  She has never undergone upper endoscopy.  Her mother has some sort of history of what family believes is peptic ulcer disease.  Generally the patient does not have issues with her appetite, with reflux disease or with chronic GI issues. Yesterday morning she had recurrence of pain in the epigastrium/left upper abdomen which did not radiate. She ate something in an attempt to lessen the pain but it made the pain worse. She had ileus but non-coffee ground and nonbloody vomiting. No diarrhea. Within the last week she took ibuprofen 600 mg maybe a couple of times for relief of menstrual cramps but she does not use NSAIDs regularly. She drinks a glass of wine maybe a couple of times a month at most.  She is thin but this is her normal weight, she is a Horticulturist, commercial.  The pain is better, well controlled following a 0.5 mg dose Dilaudid. Nausea has not recurred following single dose of Zofran. She's also received 1 dose of IV Protonix. LFTs, lipase and all chemistries normal. Initial WBC count 8.9 but has risen to 12 today. Urine pregnancy test negative. AAS films unremarkable.       Past Medical History:  Diagnosis Date  . Dehydration 01/28/2016  . Environmental allergies   . Gastric ulcer dx'd 2016  . GERD (gastroesophageal reflux disease)   . Migraine    "a few/year" (01/28/2016)    Past Surgical History:  Procedure Laterality Date  . NO PAST SURGERIES      Prior to Admission medications   Medication Sig Start Date End Date Taking? Authorizing Provider  EPIPEN 2-PAK 0.3 MG/0.3ML SOAJ injection Inject 1 application into the skin as needed (for allergic reaciton).  08/25/14  Yes Historical Provider, MD  fluticasone (FLONASE) 50 MCG/ACT nasal spray Place 2 sprays into the nose daily as needed for allergies.    Yes Historical Provider, MD  loratadine (CLARITIN) 10 MG tablet Take 10 mg by mouth daily.   Yes Historical Provider, MD  ondansetron (ZOFRAN) 4 MG tablet Take 1 tablet (4 mg total) by mouth every 6 (six) hours as needed for nausea or vomiting. 10/26/14  Yes Dione Booze, MD  pantoprazole (PROTONIX) 40 MG tablet Take 1 tablet (40 mg total) by mouth daily. 10/26/14  Yes Dione Booze, MD  promethazine (PHENERGAN) 25 MG tablet Take 12.5-25 mg by mouth every 6 (six) hours as needed for  nausea or vomiting.   Yes Historical Provider, MD    Scheduled Meds: . enoxaparin (LOVENOX) injection  40 mg Subcutaneous Q24H  . pantoprazole (PROTONIX) IV  40 mg Intravenous Q24H   Infusions: . dextrose 5 % and 0.9% NaCl 100 mL/hr at 01/29/16 0517   PRN Meds: ondansetron (ZOFRAN) IV   Allergies as of 01/28/2016 - Review Complete 01/28/2016  Allergen Reaction Noted  . Apple Shortness Of Breath and Itching 01/28/2016  . Peanuts [peanut oil] Itching and Other (See Comments) 07/20/2014  . Nsaids Nausea And Vomiting and Other (See Comments) 01/28/2016  . Shrimp [shellfish allergy] Swelling and Other (See Comments) 07/20/2014    History reviewed. No pertinent family history.  Social History   Social History  . Marital status: Single    Spouse name: N/A  . Number of  children: N/A  . Years of education: N/A   Occupational History  . Not on file.   Social History Main Topics  . Smoking status: Never Smoker  . Smokeless tobacco: Never Used  . Alcohol use No  . Drug use: No  . Sexual activity: Not on file   Other Topics Concern  . Not on file   Social History Narrative  . No narrative on file    REVIEW OF SYSTEMS: Constitutional:  Generally no issues with fatigue or weakness. ENT:  No nose bleeds Pulm:  No cough, no shortness of breath. CV:  No palpitations, no LE edema. No chest pain GU:  No hematuria, no frequency Gyn:  Recently had her period and her period is regular. GI:  Per HPI Heme:  No unusual bleeding or bruising.   Transfusions:  None ever Neuro:  Suffers from migraines a few times a year. No peripheral tingling or numbness Derm:  No itching, no rash or sores.  Endocrine:  No sweats or chills.  No polyuria or dysuria Immunization:  Did not inquire as to vaccinations. Travel:  None beyond local counties in last few months.    PHYSICAL EXAM: Vital signs in last 24 hours: Vitals:   01/28/16 2040 01/29/16 0519  BP: (!) 99/59 (!) 96/54  Pulse: 66 (!) 54  Resp: 16 16  Temp: 99.3 F (37.4 C) 98.9 F (37.2 C)   Wt Readings from Last 3 Encounters:  01/29/16 49.1 kg (108 lb 3.9 oz)    General: Thin, not ill but somewhat uncomfortable appearing AAF. She is alert. Head:  No swelling, no asymmetry.  Eyes:  No scleral icterus, no conjunctival pallor. Ears:  Not hard of hearing  Nose:  No congestion or discharge Mouth:  Moist and clear oral mucosa. Neck:  No JVD, no TMG, no masses Lungs:  Clear bilaterally. No cough or dyspnea. Heart: RRR. No MRG. S1, S2 present Abdomen:  Soft. Interestingly she is tender in the right lower quadrant not in the left upper quadrant where she is experiencing the pain. No guarding. Question slight rebound?  Bowel sounds active. No tinkling or tympanitic bowel sounds. No organomegaly..   Rectal:  Deferred the rectal exam.   Musc/Skeltl: No joint swelling, gross deformities or erythema. Extremities:  No CCE.  Neurologic:  Patient is alert. Oriented 3. Full limb strength 4. No tremor Skin:  No rashes or sores. Tattoos:  Yes Nodes:  No cervical or inguinal adenopathy.   Psych:  Pleasant, cooperative. Home  Intake/Output from previous day: 12/14 0701 - 12/15 0700 In: 1050 [P.O.:360; I.V.:690] Out: 300 [Urine:300] Intake/Output this shift: No intake/output data recorded.  LAB RESULTS:  Recent Labs  01/28/16 1848 01/29/16 0623  WBC 8.9 12.0*  HGB 11.9* 11.1*  HCT 34.3* 33.7*  PLT 203 206   BMET Lab Results  Component Value Date   NA 138 01/29/2016   NA 137 01/28/2016   NA 134 (L) 10/25/2014   K 3.6 01/29/2016   K 3.7 01/28/2016   K 3.7 10/25/2014   CL 108 01/29/2016   CL 109 01/28/2016   CL 106 10/25/2014   CO2 24 01/29/2016   CO2 20 (L) 01/28/2016   CO2 20 (L) 10/25/2014   GLUCOSE 99 01/29/2016   GLUCOSE 94 01/28/2016   GLUCOSE 117 (H) 10/25/2014   BUN 7 01/29/2016   BUN 8 01/28/2016   BUN 9 10/25/2014   CREATININE 0.75 01/29/2016   CREATININE 0.76 01/28/2016   CREATININE 0.60 10/25/2014   CALCIUM 8.6 (L) 01/29/2016   CALCIUM 9.1 01/28/2016   CALCIUM 8.9 10/25/2014   LFT  Recent Labs  01/28/16 1848  PROT 6.8  ALBUMIN 4.1  AST 20  ALT 13*  ALKPHOS 48  BILITOT 1.0   PT/INR No results found for: INR, PROTIME Hepatitis Panel No results for input(s): HEPBSAG, HCVAB, HEPAIGM, HEPBIGM in the last 72 hours. C-Diff No components found for: CDIFF Lipase     Component Value Date/Time   LIPASE 15 01/28/2016 1848    Drugs of Abuse  No results found for: LABOPIA, COCAINSCRNUR, LABBENZ, AMPHETMU, THCU, LABBARB   RADIOLOGY STUDIES: Acute Abdominal Series  Result Date: 01/28/2016 CLINICAL DATA:  20 year old female with abdominal pain, nausea and vomiting. EXAM: DG ABDOMEN ACUTE W/ 1V CHEST COMPARISON:  Abdominal CT dated 07/20/2014 FINDINGS:  There is no evidence of dilated bowel loops or free intraperitoneal air. No radiopaque calculi or other significant radiographic abnormality is seen. Heart size and mediastinal contours are within normal limits. Both lungs are clear. IMPRESSION: Negative abdominal radiographs.  No acute cardiopulmonary disease. Electronically Signed   By: Elgie CollardArash  Radparvar M.D.   On: 01/28/2016 22:38     IMPRESSION:   *  Recurrent episode of upper abdominal pain, nausea and vomiting. CT scan about a year and a half ago showed on a what looked like hydrosalpinx.  Had been taking pantoprazole for question of ulcer disease but stopped this several months ago. Now with recurrent episode. Rule out ulcer disease. LFTs and lipase are normal so biliary/pancreatic source for her symptoms is less likely. Interestingly, though the pain is felt in the left upper quadrant, her tenderness is in the right lower quadrant. White count has risen overnight. Question functional component   PLAN:     *  Have discussed the case with Dr. Dorris CarnesN. She has seen the patient. We are working on trying to schedule endoscopy for today and therefore she will be nothing by mouth.  Continue the once daily IV Protonix.   Jennye MoccasinSarah Adron Geisel  01/29/2016, 9:10 AM Pager: [phone]

## 2016-01-29 NOTE — Progress Notes (Signed)
Ref: Nancy RodriguezKADAKIA,Adesuwa Osgood S, MD   Subjective:  Feeling better. Decreasing abdominal pain. VS stable.  Objective:  Vital Signs in the last 24 hours: Temp:  [98.3 F (36.8 C)-99.3 F (37.4 C)] 98.9 F (37.2 C) (12/15 0519) Pulse Rate:  [54-96] 54 (12/15 0519) Resp:  [16] 16 (12/15 0519) BP: (91-99)/(54-59) 96/54 (12/15 0519) SpO2:  [90 %-100 %] 100 % (12/15 0519) Weight:  [49.1 kg (108 lb 3.9 oz)-50.1 kg (110 lb 7.2 oz)] 49.1 kg (108 lb 3.9 oz) (12/15 0519)  Physical Exam: BP Readings from Last 1 Encounters:  01/29/16 (!) 96/54    Wt Readings from Last 1 Encounters:  01/29/16 49.1 kg (108 lb 3.9 oz)    Weight change:  Body mass index is 19.17 kg/m. HEENT: [place]/AT, Eyes-Brown, PERL, EOMI, Conjunctiva-Pink, Sclera-Non-icteric Neck: No JVD, No bruit, Trachea midline. Lungs:  Clear, Bilateral. Cardiac:  Regular rhythm, normal S1 and S2, no S3. No murmur. Abdomen:  Soft, epigastric-tender. BS present. Extremities:  No edema present. No cyanosis. No clubbing. CNS: AxOx3, Cranial nerves grossly intact, moves all 4 extremities.  Skin: Warm and dry.   Intake/Output from previous day: 12/14 0701 - 12/15 0700 In: 1050 [P.O.:360; I.V.:690] Out: 300 [Urine:300]    Lab Results: BMET    Component Value Date/Time   NA 138 01/29/2016 0623   NA 137 01/28/2016 1848   NA 134 (L) 10/25/2014 2010   K 3.6 01/29/2016 0623   K 3.7 01/28/2016 1848   K 3.7 10/25/2014 2010   CL 108 01/29/2016 0623   CL 109 01/28/2016 1848   CL 106 10/25/2014 2010   CO2 24 01/29/2016 0623   CO2 20 (L) 01/28/2016 1848   CO2 20 (L) 10/25/2014 2010   GLUCOSE 99 01/29/2016 0623   GLUCOSE 94 01/28/2016 1848   GLUCOSE 117 (H) 10/25/2014 2010   BUN 7 01/29/2016 0623   BUN 8 01/28/2016 1848   BUN 9 10/25/2014 2010   CREATININE 0.75 01/29/2016 0623   CREATININE 0.76 01/28/2016 1848   CREATININE 0.60 10/25/2014 2010   CALCIUM 8.6 (L) 01/29/2016 0623   CALCIUM 9.1 01/28/2016 1848   CALCIUM 8.9 10/25/2014 2010   GFRNONAA >60 01/29/2016 0623   GFRNONAA >60 01/28/2016 1848   GFRNONAA >60 10/25/2014 2010   GFRAA >60 01/29/2016 0623   GFRAA >60 01/28/2016 1848   GFRAA >60 10/25/2014 2010   CBC    Component Value Date/Time   WBC 12.0 (H) 01/29/2016 0623   RBC 3.70 (L) 01/29/2016 0623   HGB 11.1 (L) 01/29/2016 0623   HCT 33.7 (L) 01/29/2016 0623   PLT 206 01/29/2016 0623   MCV 91.1 01/29/2016 0623   MCH 30.0 01/29/2016 0623   MCHC 32.9 01/29/2016 0623   RDW 13.4 01/29/2016 0623   LYMPHSABS 0.7 01/28/2016 1848   MONOABS 0.4 01/28/2016 1848   EOSABS 0.0 01/28/2016 1848   BASOSABS 0.0 01/28/2016 1848   HEPATIC Function Panel  Recent Labs  01/28/16 1848  PROT 6.8   HEMOGLOBIN A1C No components found for: HGA1C,  MPG CARDIAC ENZYMES No results found for: CKTOTAL, CKMB, CKMBINDEX, TROPONINI BNP No results for input(s): PROBNP in the last 8760 hours. TSH No results for input(s): TSH in the last 8760 hours. CHOLESTEROL No results for input(s): CHOL in the last 8760 hours.  Scheduled Meds: . enoxaparin (LOVENOX) injection  40 mg Subcutaneous Q24H  . pantoprazole (PROTONIX) IV  40 mg Intravenous Q24H   Continuous Infusions: . dextrose 5 % and 0.9% NaCl 100 mL/hr at  01/29/16 0517   PRN Meds:.ondansetron (ZOFRAN) IV  Assessment/Plan: Abdominal pain Pancreatitis ruled out Dehydration-improving  Discussed care with on call [facility]-Dr. Juliet RudeV. Nandigan. Continue current treatment.   LOS: 0 days    Orpah CobbAjay Duc Crocket  MD  01/29/2016, 8:50 AM

## 2016-01-30 ENCOUNTER — Observation Stay (HOSPITAL_COMMUNITY): Payer: Managed Care, Other (non HMO) | Admitting: Anesthesiology

## 2016-01-30 ENCOUNTER — Encounter (HOSPITAL_COMMUNITY): Payer: Self-pay | Admitting: Anesthesiology

## 2016-01-30 ENCOUNTER — Encounter (HOSPITAL_COMMUNITY)
Admission: AD | Disposition: A | Discharge status: Home / Self Care | Payer: Self-pay | Source: Ambulatory Visit | Attending: Cardiovascular Disease

## 2016-01-30 DIAGNOSIS — R1013 Epigastric pain: Secondary | ICD-10-CM

## 2016-01-30 DIAGNOSIS — G43A0 Cyclical vomiting, not intractable: Secondary | ICD-10-CM | POA: Diagnosis not present

## 2016-01-30 DIAGNOSIS — E860 Dehydration: Secondary | ICD-10-CM | POA: Diagnosis not present

## 2016-01-30 DIAGNOSIS — R1115 Cyclical vomiting syndrome unrelated to migraine: Secondary | ICD-10-CM

## 2016-01-30 HISTORY — PX: ESOPHAGOGASTRODUODENOSCOPY: SHX5428

## 2016-01-30 SURGERY — EGD (ESOPHAGOGASTRODUODENOSCOPY)
Anesthesia: Monitor Anesthesia Care

## 2016-01-30 MED ORDER — OXYCODONE-ACETAMINOPHEN 5-325 MG PO TABS
1.0000 | ORAL_TABLET | Freq: Once | ORAL | Status: AC
  Administered 2016-01-30: 1 via ORAL
  Filled 2016-01-30: qty 1

## 2016-01-30 MED ORDER — PROPOFOL 10 MG/ML IV BOLUS
INTRAVENOUS | Status: DC | PRN
  Administered 2016-01-30: 20 mg via INTRAVENOUS

## 2016-01-30 MED ORDER — ONDANSETRON HCL 4 MG/2ML IJ SOLN
INTRAMUSCULAR | Status: DC | PRN
  Administered 2016-01-30: 4 mg via INTRAVENOUS

## 2016-01-30 MED ORDER — PROPOFOL 500 MG/50ML IV EMUL
INTRAVENOUS | Status: DC | PRN
  Administered 2016-01-30: 50 ug/kg/min via INTRAVENOUS

## 2016-01-30 MED ORDER — ALUM & MAG HYDROXIDE-SIMETH 200-200-20 MG/5ML PO SUSP
30.0000 mL | Freq: Four times a day (QID) | ORAL | Status: DC | PRN
  Administered 2016-01-30: 30 mL via ORAL
  Filled 2016-01-30: qty 30

## 2016-01-30 NOTE — Anesthesia Procedure Notes (Signed)
Procedure Name: MAC Date/Time: 01/30/2016 7:53 AM Performed by: Quentin OreWALKER, Ethin Drummond E Pre-anesthesia Checklist: Patient identified, Emergency Drugs available, Suction available and Patient being monitored Patient Re-evaluated:Patient Re-evaluated prior to inductionOxygen Delivery Method: Nasal cannula Intubation Type: IV induction Placement Confirmation: positive ETCO2

## 2016-01-30 NOTE — Interval H&P Note (Signed)
History and Physical Interval Note:  01/30/2016 7:52 AM  Nancy Vaughan  has presented today for surgery, with the diagnosis of Left upper/epigastric abdominal pain. Nonbloody n/v  The various methods of treatment have been discussed with the patient and family. After consideration of risks, benefits and other options for treatment, the patient has consented to  Procedure(s): ESOPHAGOGASTRODUODENOSCOPY (EGD) (N/A) as a surgical intervention .  The patient's history has been reviewed, patient examined, no change in status, stable for surgery.  I have reviewed the patient's chart and labs.  Questions were answered to the patient's satisfaction.     Kavitha Nandigam

## 2016-01-30 NOTE — H&P (View-Only) (Signed)
                                                                           [facility] Consult: 9:10 AM 01/29/2016  LOS: 0 days    Referring Provider:  Dr Kadakia  Primary Care Physician:  KADAKIA,AJAY S, MD Primary Gastroenterologist:  Unassigned.     Reason for Consultation:  Abdominal pain.    HPI: Nancy Vaughan is a 19 y.o. female.  PMH Migraines.  2 visits to ED in 07/2014 and 10/2014 with similar to current sxs of upper abdominal nonradiating pain, nausea and vomiting. 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  She was given pain meds and antinauseal. At the second visit she was treated with hydrocodone, anti-emetics and pantoprazole.  She took the pantoprazole for about a year.  She has never undergone upper endoscopy.  Her mother has some sort of history of what family believes is peptic ulcer disease.  Generally the patient does not have issues with her appetite, with reflux disease or with chronic GI issues. Yesterday morning she had recurrence of pain in the epigastrium/left upper abdomen which did not radiate. She ate something in an attempt to lessen the pain but it made the pain worse. She had ileus but non-coffee ground and nonbloody vomiting. No diarrhea. Within the last week she took ibuprofen 600 mg maybe a couple of times for relief of menstrual cramps but she does not use NSAIDs regularly. She drinks a glass of wine maybe a couple of times a month at most.  She is thin but this is her normal weight, she is a dancer.  The pain is better, well controlled following a 0.5 mg dose Dilaudid. Nausea has not recurred following single dose of Zofran. She's also received 1 dose of IV Protonix. LFTs, lipase and all chemistries normal. Initial WBC count 8.9 but has risen to 12 today. Urine pregnancy test negative. AAS films unremarkable.       Past Medical History:  Diagnosis Date  . Dehydration 01/28/2016  . Environmental allergies   . Gastric ulcer dx'd 2016  . GERD (gastroesophageal reflux disease)   . Migraine    "a few/year" (01/28/2016)    Past Surgical History:  Procedure Laterality Date  . NO PAST SURGERIES      Prior to Admission medications   Medication Sig Start Date End Date Taking? Authorizing Provider  EPIPEN 2-PAK 0.3 MG/0.3ML SOAJ injection Inject 1 application into the skin as needed (for allergic reaciton).  08/25/14  Yes Historical Provider, MD  fluticasone (FLONASE) 50 MCG/ACT nasal spray Place 2 sprays into the nose daily as needed for allergies.    Yes Historical Provider, MD  loratadine (CLARITIN) 10 MG tablet Take 10 mg by mouth daily.   Yes Historical Provider, MD  ondansetron (ZOFRAN) 4 MG tablet Take 1 tablet (4 mg total) by mouth every 6 (six) hours as needed for nausea or vomiting. 10/26/14  Yes David Glick, MD  pantoprazole (PROTONIX) 40 MG tablet Take 1 tablet (40 mg total) by mouth daily. 10/26/14  Yes David Glick, MD  promethazine (PHENERGAN) 25 MG tablet Take 12.5-25 mg by mouth every 6 (six) hours as needed for   nausea or vomiting.   Yes Historical Provider, MD    Scheduled Meds: . enoxaparin (LOVENOX) injection  40 mg Subcutaneous Q24H  . pantoprazole (PROTONIX) IV  40 mg Intravenous Q24H   Infusions: . dextrose 5 % and 0.9% NaCl 100 mL/hr at 01/29/16 0517   PRN Meds: ondansetron (ZOFRAN) IV   Allergies as of 01/28/2016 - Review Complete 01/28/2016  Allergen Reaction Noted  . Apple Shortness Of Breath and Itching 01/28/2016  . Peanuts [peanut oil] Itching and Other (See Comments) 07/20/2014  . Nsaids Nausea And Vomiting and Other (See Comments) 01/28/2016  . Shrimp [shellfish allergy] Swelling and Other (See Comments) 07/20/2014    History reviewed. No pertinent family history.  Social History   Social History  . Marital status: Single    Spouse name: N/A  . Number of  children: N/A  . Years of education: N/A   Occupational History  . Not on file.   Social History Main Topics  . Smoking status: Never Smoker  . Smokeless tobacco: Never Used  . Alcohol use No  . Drug use: No  . Sexual activity: Not on file   Other Topics Concern  . Not on file   Social History Narrative  . No narrative on file    REVIEW OF SYSTEMS: Constitutional:  Generally no issues with fatigue or weakness. ENT:  No nose bleeds Pulm:  No cough, no shortness of breath. CV:  No palpitations, no LE edema. No chest pain GU:  No hematuria, no frequency Gyn:  Recently had her period and her period is regular. GI:  Per HPI Heme:  No unusual bleeding or bruising.   Transfusions:  None ever Neuro:  Suffers from migraines a few times a year. No peripheral tingling or numbness Derm:  No itching, no rash or sores.  Endocrine:  No sweats or chills.  No polyuria or dysuria Immunization:  Did not inquire as to vaccinations. Travel:  None beyond local counties in last few months.    PHYSICAL EXAM: Vital signs in last 24 hours: Vitals:   01/28/16 2040 01/29/16 0519  BP: (!) 99/59 (!) 96/54  Pulse: 66 (!) 54  Resp: 16 16  Temp: 99.3 F (37.4 C) 98.9 F (37.2 C)   Wt Readings from Last 3 Encounters:  01/29/16 49.1 kg (108 lb 3.9 oz)    General: Thin, not ill but somewhat uncomfortable appearing AAF. She is alert. Head:  No swelling, no asymmetry.  Eyes:  No scleral icterus, no conjunctival pallor. Ears:  Not hard of hearing  Nose:  No congestion or discharge Mouth:  Moist and clear oral mucosa. Neck:  No JVD, no TMG, no masses Lungs:  Clear bilaterally. No cough or dyspnea. Heart: RRR. No MRG. S1, S2 present Abdomen:  Soft. Interestingly she is tender in the right lower quadrant not in the left upper quadrant where she is experiencing the pain. No guarding. Question slight rebound?  Bowel sounds active. No tinkling or tympanitic bowel sounds. No organomegaly..   Rectal:  Deferred the rectal exam.   Musc/Skeltl: No joint swelling, gross deformities or erythema. Extremities:  No CCE.  Neurologic:  Patient is alert. Oriented 3. Full limb strength 4. No tremor Skin:  No rashes or sores. Tattoos:  Yes Nodes:  No cervical or inguinal adenopathy.   Psych:  Pleasant, cooperative. Home  Intake/Output from previous day: 12/14 0701 - 12/15 0700 In: 1050 [P.O.:360; I.V.:690] Out: 300 [Urine:300] Intake/Output this shift: No intake/output data recorded.  LAB RESULTS:    Recent Labs  01/28/16 1848 01/29/16 0623  WBC 8.9 12.0*  HGB 11.9* 11.1*  HCT 34.3* 33.7*  PLT 203 206   BMET Lab Results  Component Value Date   NA 138 01/29/2016   NA 137 01/28/2016   NA 134 (L) 10/25/2014   K 3.6 01/29/2016   K 3.7 01/28/2016   K 3.7 10/25/2014   CL 108 01/29/2016   CL 109 01/28/2016   CL 106 10/25/2014   CO2 24 01/29/2016   CO2 20 (L) 01/28/2016   CO2 20 (L) 10/25/2014   GLUCOSE 99 01/29/2016   GLUCOSE 94 01/28/2016   GLUCOSE 117 (H) 10/25/2014   BUN 7 01/29/2016   BUN 8 01/28/2016   BUN 9 10/25/2014   CREATININE 0.75 01/29/2016   CREATININE 0.76 01/28/2016   CREATININE 0.60 10/25/2014   CALCIUM 8.6 (L) 01/29/2016   CALCIUM 9.1 01/28/2016   CALCIUM 8.9 10/25/2014   LFT  Recent Labs  01/28/16 1848  PROT 6.8  ALBUMIN 4.1  AST 20  ALT 13*  ALKPHOS 48  BILITOT 1.0   PT/INR No results found for: INR, PROTIME Hepatitis Panel No results for input(s): HEPBSAG, HCVAB, HEPAIGM, HEPBIGM in the last 72 hours. C-Diff No components found for: CDIFF Lipase     Component Value Date/Time   LIPASE 15 01/28/2016 1848    Drugs of Abuse  No results found for: LABOPIA, COCAINSCRNUR, LABBENZ, AMPHETMU, THCU, LABBARB   RADIOLOGY STUDIES: Acute Abdominal Series  Result Date: 01/28/2016 CLINICAL DATA:  19-year-old female with abdominal pain, nausea and vomiting. EXAM: DG ABDOMEN ACUTE W/ 1V CHEST COMPARISON:  Abdominal CT dated 07/20/2014 FINDINGS:  There is no evidence of dilated bowel loops or free intraperitoneal air. No radiopaque calculi or other significant radiographic abnormality is seen. Heart size and mediastinal contours are within normal limits. Both lungs are clear. IMPRESSION: Negative abdominal radiographs.  No acute cardiopulmonary disease. Electronically Signed   By: Arash  Radparvar M.D.   On: 01/28/2016 22:38     IMPRESSION:   *  Recurrent episode of upper abdominal pain, nausea and vomiting. CT scan about a year and a half ago showed on a what looked like hydrosalpinx.  Had been taking pantoprazole for question of ulcer disease but stopped this several months ago. Now with recurrent episode. Rule out ulcer disease. LFTs and lipase are normal so biliary/pancreatic source for her symptoms is less likely. Interestingly, though the pain is felt in the left upper quadrant, her tenderness is in the right lower quadrant. White count has risen overnight. Question functional component   PLAN:     *  Have discussed the case with Dr. N. She has seen the patient. We are working on trying to schedule endoscopy for today and therefore she will be nothing by mouth.  Continue the once daily IV Protonix.   Tyreona Panjwani  01/29/2016, 9:10 AM Pager: [phone]     

## 2016-01-30 NOTE — Op Note (Signed)
[facility] Patient Name: Nancy GroutJade Vaughan Procedure Date : 01/30/2016 MRN: [id number] Attending MD: Nancy FormKavitha V. Calel Vaughan , MD Date of Birth: 1995/04/19 CSN: [id number] Age: 20 Admit Type: Inpatient Procedure:                Upper GI endoscopy Indications:              Epigastric abdominal pain, Functional Dyspepsia,                            Nausea with vomiting Providers:                Nancy FormKavitha V. Vidya Bamford, MD, Nancy Saraniel Madden, RN, Nancy Blendavida                            Vaughan, Technician Referring MD:              Medicines:                Monitored Anesthesia Care Complications:            No immediate complications. Estimated Blood Loss:     Estimated blood loss was minimal. Procedure:                Pre-Anesthesia Assessment:                           - Prior to the procedure, a History and Physical                            was performed, and patient medications and                            allergies were reviewed. The patient's tolerance of                            previous anesthesia was also reviewed. The risks                            and benefits of the procedure and the sedation                            options and risks were discussed with the patient.                            All questions were answered, and informed consent                            was obtained. Prior Anticoagulants: The patient has                            taken no previous anticoagulant or antiplatelet                            agents. ASA Grade Assessment: II - A patient with  mild systemic disease. After reviewing the risks                            and benefits, the patient was deemed in                            satisfactory condition to undergo the procedure.                           After obtaining informed consent, the endoscope was                            passed under direct vision. Throughout the                            procedure,  the patient's blood pressure, pulse, and                            oxygen saturations were monitored continuously. The                            EG-[id number] ([id number]) scope was introduced through the                            mouth, and advanced to the third part of duodenum.                            The upper GI endoscopy was accomplished without                            difficulty. The patient tolerated the procedure                            well. Scope In: Scope Out: Findings:      LA Grade C (one or more mucosal breaks continuous between tops of 2 or       more mucosal folds, less than 75% circumference) esophagitis with no       bleeding was found 34 to 35 cm from the incisors.      A bleeding superficial mucosal tear that occurred during the procedure       was found at the gastroesophageal junction in the cardia. This measured       less than 1 mm in length. It occured due to patient's retching and       belching during the procedure, was difficult to sedate.      The cardia and gastric fundus were normal on retroflexion.      Striped mildly erythematous mucosa without bleeding was found in the       entire examined stomach. Biopsies were taken with a cold forceps for       Helicobacter pylori testing.      The duodenal bulb and second portion of the duodenum were normal.       Biopsies for histology were taken with a cold forceps for evaluation of       celiac disease.      The exam was otherwise  without abnormality. Impression:               - LA Grade C erosive esophagitis likely in the                            setting of recurrent vomiting and reflux due to                            belching                           - Minor superficial mucosal tear at the                            gastroesophageal junction in the cardia.                           - Erythematous mucosa in the stomach. Biopsied.                           - Normal duodenal bulb and second portion of  the                            duodenum. Biopsied.                           - The examination was otherwise normal. No                            significant pathology to explain the severe                            abdominal pain Moderate Sedation:      N/A Recommendation:           - Patient has a contact number available for                            emergencies. The signs and symptoms of potential                            delayed complications were discussed with the                            patient. Return to normal activities tomorrow.                            Written discharge instructions were provided to the                            patient.                           - Resume previous diet.                           - Continue present medications.                           -  Await pathology results.                           - No repeat upper endoscopy.                           -Will sign off, please call with any questions Procedure Code(s):        --- Professional ---                           [phone] [id number], Esophagogastroduodenoscopy, flexible,                            transoral; with biopsy, single or multiple Diagnosis Code(s):        --- Professional ---                           K20.8, Other esophagitis                           S27.818A, Other injury of esophagus (thoracic                            part), initial encounter                           K31.89, Other diseases of stomach and duodenum                           R10.13, Epigastric pain                           K30, Functional dyspepsia                           R11.2, Nausea with vomiting, unspecified CPT copyright 2016 American Medical Association. All rights reserved. The codes documented in this report are preliminary and upon coder review may  be revised to meet current compliance requirements. Nancy FormKavitha V. Nancy Rossi, MD 01/30/2016 8:35:15 AM This report has been signed electronically. Number of  Addenda: 0

## 2016-01-30 NOTE — Anesthesia Postprocedure Evaluation (Signed)
Anesthesia Post Note  Patient: Nancy GroutJade Vaughan  Procedure(s) Performed: Procedure(s) (LRB): ESOPHAGOGASTRODUODENOSCOPY (EGD) (N/A)  Patient location during evaluation: PACU Anesthesia Type: MAC Level of consciousness: awake and alert Pain management: pain level controlled Vital Signs Assessment: post-procedure vital signs reviewed and stable Respiratory status: spontaneous breathing, nonlabored ventilation, respiratory function stable and patient connected to nasal cannula oxygen Cardiovascular status: stable and blood pressure returned to baseline Anesthetic complications: no    Last Vitals:  Vitals:   01/30/16 0825 01/30/16 0830  BP: (!) 105/50 (!) 100/54  Pulse: (!) 53 (!) 53  Resp: 15 16  Temp:      Last Pain:  Vitals:   01/30/16 0740  TempSrc:   PainSc: 3                  Lillyth Spong S

## 2016-01-30 NOTE — Transfer of Care (Signed)
Immediate Anesthesia Transfer of Care Note  Patient: Nancy GroutJade Vaughan  Procedure(s) Performed: Procedure(s): ESOPHAGOGASTRODUODENOSCOPY (EGD) (N/A)  Patient Location: Endoscopy Unit  Anesthesia Type:MAC  Level of Consciousness: awake, alert  and oriented  Airway & Oxygen Therapy: Patient Spontanous Breathing and Patient connected to nasal cannula oxygen  Post-op Assessment: Report given to RN, Post -op Vital signs reviewed and stable and Patient moving all extremities  Post vital signs: Reviewed and stable  Last Vitals:  Vitals:   01/30/16 0640 01/30/16 0740  BP: (!) 101/53 (!) 99/50  Pulse: (!) 48 (!) 48  Resp: 17 15  Temp: 37.2 C     Last Pain:  Vitals:   01/30/16 0740  TempSrc:   PainSc: 3       Patients Stated Pain Goal: 2 (01/29/16 0816)  Complications: No apparent anesthesia complications

## 2016-01-30 NOTE — Anesthesia Preprocedure Evaluation (Addendum)
Anesthesia Evaluation  Patient identified by MRN, date of birth, ID band Patient awake    Reviewed: Allergy & Precautions, NPO status , Patient's Chart, lab work & pertinent test results  Airway Mallampati: I       Dental  (+) Teeth Intact   Pulmonary neg pulmonary ROS,    breath sounds clear to auscultation       Cardiovascular negative cardio ROS   Rhythm:regular Rate:Normal     Neuro/Psych  Headaches,    GI/Hepatic PUD, GERD  ,  Endo/Other    Renal/GU      Musculoskeletal   Abdominal   Peds  Hematology   Anesthesia Other Findings   Reproductive/Obstetrics                            Anesthesia Physical Anesthesia Plan  ASA: I  Anesthesia Plan: MAC   Post-op Pain Management:    Induction: Intravenous  Airway Management Planned: Nasal Cannula  Additional Equipment:   Intra-op Plan:   Post-operative Plan:   Informed Consent: I have reviewed the patients History and Physical, chart, labs and discussed the procedure including the risks, benefits and alternatives for the proposed anesthesia with the patient or authorized representative who has indicated his/her understanding and acceptance.   Dental advisory given  Plan Discussed with: CRNA, Anesthesiologist and Surgeon  Anesthesia Plan Comments:        Anesthesia Quick Evaluation

## 2016-01-31 MED ORDER — OXYCODONE-ACETAMINOPHEN 5-325 MG PO TABS: 1.0000 | ORAL_TABLET | Freq: Two times a day (BID) | ORAL | 0 refills | Status: DC | PRN

## 2016-01-31 MED ORDER — ALUM & MAG HYDROXIDE-SIMETH 200-200-20 MG/5ML PO SUSP: 30.0000 mL | Freq: Four times a day (QID) | ORAL | 0 refills | Status: DC | PRN

## 2016-01-31 NOTE — Discharge Summary (Signed)
Physician Discharge Summary  Patient ID: Nancy GroutJade Dershem MRN: [id number] DOB/AGE: Jan 08, 1996 19 y.o.  Admit date: 01/28/2016 Discharge date: 01/31/2016  Admission Diagnoses: Acute dehydration Acute abdominal pain H/O gastritis R/O pancreatitis Discharge Diagnoses:  Principal Problem:   Dehydration Active Problems:   Epigastric abdominal pain   Non-intractable cyclical vomiting with nausea   Esophagitis   Anxiety  Discharged Condition: fair  Hospital Course: 20 year old female had 2 days history of epigastric abdominal pain with recurrent nausea, vomiting and weakness. She was treated with IV fluids, IV Protonix and SL ondansetron. She also underwent upper endoscopy showing esophagitis and 1 mm iatrogenic superficial tear of lower esophagus. She was observed for one additional day post procedure. She was discharged with near resolution of abdominal pain, nausea and dehydration. She will be followed by me in 1 week.  Consults: GI  Significant Diagnostic Studies: labs: Near normal CBC and BMET except borderline low Hgb of 11.9 g/dL. Amylase and Lipase were normal.  X-ray abdomen and chest were unremarkable.  EGD showed mild esophagitis. Biopsy report pending.  Treatments: IV hydration, analgesia: IV dilaudid followed by PO oxycodone and IV Protonix followed by PO Protonix.  Discharge Exam: Blood pressure (!) 97/42, pulse (!) 59, temperature 98.2 F (36.8 C), temperature source Oral, resp. rate 17, height 5\' 3"  (1.6 m), weight 50.5 kg (111 lb 5.3 oz), last menstrual period 01/25/2016, SpO2 100 %. General appearance: alert, cooperative and appears stated age Head: Normocephalic, atraumatic. Eyes: Brown eyes, pink conjunctivae/corneas clear. PERRL, EOM's intact.  Neck: no adenopathy, no carotid bruit, no JVD, supple, symmetrical, trachea midline and thyroid not enlarged. Resp: clear to auscultation bilaterally. Cardio: regular rate and rhythm, S1, S2 normal, no murmur, click, rub  or gallop. GI: soft, non-tender; bowel sounds normal; no masses,  no organomegaly. Extremities: extremities normal, no cyanosis or edema. Skin: Warm and dry. No rashes or lesions. Neurologic: Alert and oriented X 3, normal strength and tone. Normal coordination and gait.  Disposition: 01-Home or Self Care   Allergies as of 01/31/2016      Reactions   Apple Shortness Of Breath, Itching   Itchy throat   Peanuts [peanut Oil] Itching, Other (See Comments)   Itchy throat and sneezing    Nsaids Nausea And Vomiting, Other (See Comments)   Flares her ulcers (CANNOT TAKE)   Shrimp [shellfish Allergy] Swelling, Other (See Comments)   Congestion (Can tolerate in a limited amount or will have to counteract with Benadryl)      Medication List    STOP taking these medications   promethazine 25 MG tablet Commonly known as:  PHENERGAN     TAKE these medications   alum & mag hydroxide-simeth 200-200-20 MG/5ML suspension Commonly known as:  MAALOX/MYLANTA Take 30 mLs by mouth every 6 (six) hours as needed for indigestion or heartburn.   EPIPEN 2-PAK 0.3 mg/0.3 mL Soaj injection Generic drug:  EPINEPHrine Inject 1 application into the skin as needed (for allergic reaciton).   fluticasone 50 MCG/ACT nasal spray Commonly known as:  FLONASE Place 2 sprays into the nose daily as needed for allergies.   loratadine 10 MG tablet Commonly known as:  CLARITIN Take 10 mg by mouth daily.   ondansetron 4 MG tablet Commonly known as:  ZOFRAN Take 1 tablet (4 mg total) by mouth every 6 (six) hours as needed for nausea or vomiting.   oxyCODONE-acetaminophen 5-325 MG tablet Commonly known as:  PERCOCET/ROXICET Take 1 tablet by mouth every 12 (twelve) hours as needed  for moderate pain.   pantoprazole 40 MG tablet Commonly known as:  PROTONIX Take 1 tablet (40 mg total) by mouth daily.      Follow-up Information    [facility],Macalister Arnaud S, MD. Schedule an appointment as soon as possible for a visit in  1 week(s).   Specialty:  Cardiology Contact information: [address] [place] [address] [phone]           Signed: Ricki RodriguezKADAKIA,Rushil Kimbrell S 01/31/2016, 10:58 AM

## 2016-02-01 ENCOUNTER — Encounter (HOSPITAL_COMMUNITY): Payer: Self-pay | Admitting: Gastroenterology

## 2016-03-25 ENCOUNTER — Encounter (HOSPITAL_COMMUNITY): Payer: Self-pay | Admitting: Oncology

## 2016-03-25 DIAGNOSIS — Z91010 Allergy to peanuts: Secondary | ICD-10-CM | POA: Diagnosis not present

## 2016-03-25 DIAGNOSIS — R1013 Epigastric pain: Secondary | ICD-10-CM | POA: Insufficient documentation

## 2016-03-25 LAB — CBC
HEMATOCRIT: 37.2 % (ref 36.0–46.0)
HEMOGLOBIN: 12.5 g/dL (ref 12.0–15.0)
MCH: 30.6 pg (ref 26.0–34.0)
MCHC: 33.6 g/dL (ref 30.0–36.0)
MCV: 91.2 fL (ref 78.0–100.0)
Platelets: 281 10*3/uL (ref 150–400)
RBC: 4.08 MIL/uL (ref 3.87–5.11)
RDW: 13.8 % (ref 11.5–15.5)
WBC: 8.4 10*3/uL (ref 4.0–10.5)

## 2016-03-25 NOTE — ED Triage Notes (Signed)
Pt reports N/V/D and abdominal pain since this am.  Pt ate [employer] earlier and is concerned that she may have food poisoning.  Pt rates abdominal pain 3/10.

## 2016-03-26 ENCOUNTER — Emergency Department (HOSPITAL_COMMUNITY)
Admission: EM | Admit: 2016-03-26 | Discharge: 2016-03-26 | Disposition: A | Discharge status: Home / Self Care | Payer: Managed Care, Other (non HMO) | Attending: Emergency Medicine | Admitting: Emergency Medicine

## 2016-03-26 DIAGNOSIS — R1013 Epigastric pain: Secondary | ICD-10-CM

## 2016-03-26 LAB — COMPREHENSIVE METABOLIC PANEL
ALK PHOS: 51 U/L (ref 38–126)
ALT: 15 U/L (ref 14–54)
AST: 21 U/L (ref 15–41)
Albumin: 4.5 g/dL (ref 3.5–5.0)
Anion gap: 9 (ref 5–15)
BUN: 12 mg/dL (ref 6–20)
CALCIUM: 9.2 mg/dL (ref 8.9–10.3)
CO2: 20 mmol/L — AB (ref 22–32)
CREATININE: 0.59 mg/dL (ref 0.44–1.00)
Chloride: 107 mmol/L (ref 101–111)
Glucose, Bld: 123 mg/dL — ABNORMAL HIGH (ref 65–99)
Potassium: 4 mmol/L (ref 3.5–5.1)
Sodium: 136 mmol/L (ref 135–145)
Total Bilirubin: 0.9 mg/dL (ref 0.3–1.2)
Total Protein: 7.4 g/dL (ref 6.5–8.1)

## 2016-03-26 LAB — URINALYSIS, ROUTINE W REFLEX MICROSCOPIC
BACTERIA UA: NONE SEEN
Bilirubin Urine: NEGATIVE
Glucose, UA: NEGATIVE mg/dL
HGB URINE DIPSTICK: NEGATIVE
Ketones, ur: 80 mg/dL — AB
Leukocytes, UA: NEGATIVE
NITRITE: NEGATIVE
Protein, ur: 30 mg/dL — AB
SPECIFIC GRAVITY, URINE: 1.031 — AB (ref 1.005–1.030)
pH: 7 (ref 5.0–8.0)

## 2016-03-26 LAB — LIPASE, BLOOD: Lipase: 15 U/L (ref 11–51)

## 2016-03-26 MED ORDER — PANTOPRAZOLE SODIUM 40 MG PO TBEC: 40.0000 mg | DELAYED_RELEASE_TABLET | Freq: Every day | ORAL | 0 refills | Status: DC

## 2016-03-26 MED ORDER — HALOPERIDOL LACTATE 5 MG/ML IJ SOLN
2.0000 mg | Freq: Once | INTRAMUSCULAR | Status: AC
  Administered 2016-03-26: 2 mg via INTRAVENOUS
  Filled 2016-03-26: qty 1

## 2016-03-26 MED ORDER — ALUM & MAG HYDROXIDE-SIMETH 200-200-20 MG/5ML PO SUSP: 30.0000 mL | Freq: Four times a day (QID) | ORAL | 0 refills | Status: DC | PRN

## 2016-03-26 MED ORDER — FAMOTIDINE IN NACL 20-0.9 MG/50ML-% IV SOLN
20.0000 mg | Freq: Once | INTRAVENOUS | Status: AC
  Administered 2016-03-26: 20 mg via INTRAVENOUS
  Filled 2016-03-26: qty 50

## 2016-03-26 MED ORDER — KETOROLAC TROMETHAMINE 30 MG/ML IJ SOLN
30.0000 mg | Freq: Once | INTRAMUSCULAR | Status: AC
  Administered 2016-03-26: 30 mg via INTRAVENOUS
  Filled 2016-03-26: qty 1

## 2016-03-26 MED ORDER — SODIUM CHLORIDE 0.9 % IV BOLUS (SEPSIS)
1000.0000 mL | Freq: Once | INTRAVENOUS | Status: AC
  Administered 2016-03-26: 1000 mL via INTRAVENOUS

## 2016-04-17 NOTE — ED Provider Notes (Signed)
[facility]-EMERGENCY DEPT Provider Note   CSN: [id number] Arrival date & time: 03/25/16  2200    History   Chief Complaint Chief Complaint  Patient presents with  . Abdominal Pain    HPI Nancy Vaughan is a 21 y.o. female.  The history is provided by the patient. No language interpreter was used.  Abdominal Pain   This is a recurrent problem. The problem occurs constantly. The problem has been gradually worsening. The pain is located in the epigastric region. The quality of the pain is sharp. The pain is at a severity of 3/10. The pain is mild. Associated symptoms include nausea and vomiting. Pertinent negatives include fever, hematochezia, dysuria, frequency and hematuria. Nothing relieves the symptoms. Past workup comments: Prior EGD. Her past medical history is significant for GERD.    Past Medical History:  Diagnosis Date  . Dehydration 01/28/2016  . Environmental allergies   . Gastric ulcer dx'd 2016  . GERD (gastroesophageal reflux disease)   . Migraine    "a few/year" (01/28/2016)    Patient Active Problem List   Diagnosis Date Noted  . Epigastric abdominal pain   . Non-intractable cyclical vomiting with nausea   . Dehydration 01/28/2016    Class: Acute  . Abdominal pain 01/28/2016    Past Surgical History:  Procedure Laterality Date  . ESOPHAGOGASTRODUODENOSCOPY N/A 01/30/2016   Procedure: ESOPHAGOGASTRODUODENOSCOPY (EGD);  Surgeon: Napoleon Form, MD;  Location: [facility] ENDOSCOPY;  Service: Endoscopy;  Laterality: N/A;  . NO PAST SURGERIES      OB History    No data available       Home Medications    Prior to Admission medications   Medication Sig Start Date End Date Taking? Authorizing Provider  EPIPEN 2-PAK 0.3 MG/0.3ML SOAJ injection Inject 1 application into the skin as needed (for allergic reaciton).  08/25/14  Yes Historical Provider, MD  fluticasone (FLONASE) 50 MCG/ACT nasal spray Place 2 sprays into the nose daily as needed for allergies.    Yes  Historical Provider, MD  loratadine (CLARITIN) 10 MG tablet Take 10 mg by mouth daily.   Yes Historical Provider, MD  alum & mag hydroxide-simeth (MAALOX/MYLANTA) 200-200-20 MG/5ML suspension Take 30 mLs by mouth every 6 (six) hours as needed for indigestion or heartburn. 03/26/16   Antony Madura, PA-C  pantoprazole (PROTONIX) 40 MG tablet Take 1 tablet (40 mg total) by mouth daily. 03/26/16   Antony Madura, PA-C    Family History No family history on file.  Social History Social History  Substance Use Topics  . Smoking status: Never Smoker  . Smokeless tobacco: Never Used  . Alcohol use No     Allergies   Apple; Peanuts [peanut oil]; Nsaids; and Shrimp [shellfish allergy]   Review of Systems Review of Systems  Constitutional: Negative for fever.  Gastrointestinal: Positive for abdominal pain, nausea and vomiting. Negative for hematochezia.  Genitourinary: Negative for dysuria, frequency and hematuria.  Ten systems reviewed and are negative for acute change, except as noted in the HPI.    Physical Exam Updated Vital Signs BP (!) 101/51   Pulse 75   Temp 98.8 F (37.1 C) (Oral)   Resp 18   Ht 5\' 3"  (1.6 m)   Wt 48.5 kg   LMP 02/29/2016 (Approximate)   SpO2 100%   BMI 18.95 kg/m   Physical Exam  Constitutional: She is oriented to person, place, and time. She appears well-developed and well-nourished. No distress.  Nontoxic and in NAD  HENT:  Head:  Normocephalic and atraumatic.  Eyes: Conjunctivae and EOM are normal. No scleral icterus.  Neck: Normal range of motion.  Cardiovascular: Normal rate, regular rhythm and intact distal pulses.   Pulmonary/Chest: Effort normal. No respiratory distress. She has no wheezes. She has no rales.  Respirations even and unlabored. Lungs CTAB.  Abdominal: Soft. She exhibits no distension and no mass.  Soft abdomen with mild epigastric TTP. No involuntary guarding, distension, or peritoneal signs.  Musculoskeletal: Normal range of  motion.  Neurological: She is alert and oriented to person, place, and time. She exhibits normal muscle tone. Coordination normal.  GCS 15. Patient moving all extremities.  Skin: Skin is warm and dry. No rash noted. She is not diaphoretic. No erythema. No pallor.  Psychiatric: She has a normal mood and affect. Her behavior is normal.  Nursing note and vitals reviewed.    ED Treatments / Results  Labs (all labs ordered are listed, but only abnormal results are displayed) Labs Reviewed  COMPREHENSIVE METABOLIC PANEL - Abnormal; Notable for the following:       Result Value   CO2 20 (*)    Glucose, Bld 123 (*)    All other components within normal limits  URINALYSIS, ROUTINE W REFLEX MICROSCOPIC - Abnormal; Notable for the following:    Specific Gravity, Urine 1.031 (*)    Ketones, ur 80 (*)    Protein, ur 30 (*)    Squamous Epithelial / LPF 0-5 (*)    All other components within normal limits  LIPASE, BLOOD  CBC    EKG  EKG Interpretation None       Radiology No results found.  Procedures Procedures (including critical care time)  Medications Ordered in ED Medications  sodium chloride 0.9 % bolus 1,000 mL (0 mLs Intravenous Stopped 03/26/16 0631)  famotidine (PEPCID) IVPB 20 mg premix (0 mg Intravenous Stopped 03/26/16 0436)  haloperidol lactate (HALDOL) injection 2 mg (2 mg Intravenous Given 03/26/16 0404)  ketorolac (TORADOL) 30 MG/ML injection 30 mg (30 mg Intravenous Given 03/26/16 0403)     Initial Impression / Assessment and Plan / ED Course  I have reviewed the triage vital signs and the nursing notes.  Pertinent labs & imaging results that were available during my care of the patient were reviewed by me and considered in my medical decision making (see chart for details).     Patient presents for acute on chronic epigastric pain. Symptoms began after eating [employer]. Patient has no signs of acute surgical abdomen today. Laboratory workup is reassuring. Patient  is afebrile and without leukocytosis. She is hemodynamically stable. Symptoms have improved with supportive care in the ED. Patient also hydrated with IV fluids.   On reassessment, patient able to tolerate POs. Per prior EGD, symptoms suspected secondary to reflux/gastritis. Will provide prescription for Maalox and Protonix. Primary care for up advised in return precautions given. Patient discharged in stable condition with no unaddressed concerns.   Vitals:   03/25/16 2216 03/25/16 2218 03/26/16 0647  BP: 108/65  (!) 101/51  Pulse: 91  75  Resp: 16  18  Temp: 98.9 F (37.2 C)  98.8 F (37.1 C)  TempSrc: Oral  Oral  SpO2: 100%  100%  Weight:  48.5 kg   Height:  5\' 3"  (1.6 m)      Final Clinical Impressions(s) / ED Diagnoses   Final diagnoses:  Epigastric pain    New Prescriptions Discharge Medication List as of 03/26/2016  6:48 AM  Antony Madura, PA-C 04/17/16 0038    Cy Blamer, MD 04/17/16 [phone]

## 2016-05-25 ENCOUNTER — Encounter (HOSPITAL_COMMUNITY): Payer: Self-pay

## 2016-05-25 ENCOUNTER — Encounter (HOSPITAL_COMMUNITY): Payer: Self-pay | Admitting: Emergency Medicine

## 2016-05-25 ENCOUNTER — Emergency Department (HOSPITAL_COMMUNITY)
Admission: EM | Admit: 2016-05-25 | Discharge: 2016-05-26 | Disposition: A | Discharge status: Home / Self Care | Payer: Managed Care, Other (non HMO) | Attending: Emergency Medicine | Admitting: Emergency Medicine

## 2016-05-25 ENCOUNTER — Ambulatory Visit (HOSPITAL_COMMUNITY)
Admission: EM | Admit: 2016-05-25 | Discharge: 2016-05-25 | Disposition: A | Discharge status: Nursing Home (Includes ICF) | Payer: Managed Care, Other (non HMO) | Attending: Family Medicine | Admitting: Family Medicine

## 2016-05-25 DIAGNOSIS — E860 Dehydration: Secondary | ICD-10-CM

## 2016-05-25 DIAGNOSIS — R197 Diarrhea, unspecified: Principal | ICD-10-CM

## 2016-05-25 DIAGNOSIS — K219 Gastro-esophageal reflux disease without esophagitis: Secondary | ICD-10-CM | POA: Diagnosis not present

## 2016-05-25 DIAGNOSIS — Z91010 Allergy to peanuts: Secondary | ICD-10-CM | POA: Insufficient documentation

## 2016-05-25 DIAGNOSIS — R112 Nausea with vomiting, unspecified: Secondary | ICD-10-CM

## 2016-05-25 DIAGNOSIS — R1013 Epigastric pain: Secondary | ICD-10-CM | POA: Diagnosis present

## 2016-05-25 LAB — CBC
HCT: 36.7 % (ref 36.0–46.0)
Hemoglobin: 12.1 g/dL (ref 12.0–15.0)
MCH: 30.4 pg (ref 26.0–34.0)
MCHC: 33 g/dL (ref 30.0–36.0)
MCV: 92.2 fL (ref 78.0–100.0)
PLATELETS: 260 10*3/uL (ref 150–400)
RBC: 3.98 MIL/uL (ref 3.87–5.11)
RDW: 13 % (ref 11.5–15.5)
WBC: 13.7 10*3/uL — AB (ref 4.0–10.5)

## 2016-05-25 LAB — COMPREHENSIVE METABOLIC PANEL
ALT: 15 U/L (ref 14–54)
AST: 22 U/L (ref 15–41)
Albumin: 4.4 g/dL (ref 3.5–5.0)
Alkaline Phosphatase: 49 U/L (ref 38–126)
Anion gap: 9 (ref 5–15)
BUN: 13 mg/dL (ref 6–20)
CO2: 20 mmol/L — ABNORMAL LOW (ref 22–32)
CREATININE: 0.73 mg/dL (ref 0.44–1.00)
Calcium: 9.2 mg/dL (ref 8.9–10.3)
Chloride: 107 mmol/L (ref 101–111)
Glucose, Bld: 112 mg/dL — ABNORMAL HIGH (ref 65–99)
Potassium: 3.9 mmol/L (ref 3.5–5.1)
SODIUM: 136 mmol/L (ref 135–145)
Total Bilirubin: 0.9 mg/dL (ref 0.3–1.2)
Total Protein: 6.9 g/dL (ref 6.5–8.1)

## 2016-05-25 LAB — I-STAT BETA HCG BLOOD, ED (MC, WL, AP ONLY)

## 2016-05-25 LAB — LIPASE, BLOOD: Lipase: <10 U/L — ABNORMAL LOW (ref 11–51)

## 2016-05-25 MED ORDER — ONDANSETRON 4 MG PO TBDP
4.0000 mg | ORAL_TABLET | Freq: Once | ORAL | Status: AC
  Administered 2016-05-26: 4 mg via ORAL
  Filled 2016-05-25: qty 1

## 2016-05-25 MED ORDER — SODIUM CHLORIDE 0.9 % IV BOLUS (SEPSIS)
1000.0000 mL | Freq: Once | INTRAVENOUS | Status: AC
  Administered 2016-05-26: 1000 mL via INTRAVENOUS

## 2016-05-25 MED ORDER — SUCRALFATE 1 G PO TABS
1.0000 g | ORAL_TABLET | Freq: Once | ORAL | Status: AC
  Administered 2016-05-26: 1 g via ORAL
  Filled 2016-05-25: qty 1

## 2016-05-25 NOTE — ED Provider Notes (Signed)
[facility] Provider Note   CSN: [id number] Arrival date & time: 05/25/16  1844     History   Chief Complaint Chief Complaint  Patient presents with  . Abdominal Pain    HPI Nancy Vaughan is a 21 y.o. female.  Is a 21 year old female with a history of chronic epigastric pain, nausea and vomiting.  She had endoscopy in December 2017 which showed normal pathology.  She was prescribed PPI, which she did not fill.  She was again seen in the emergency department several weeks later again for persistent nausea, vomiting and epigastric pain again prescribed PPI, which she did not fill.  She presents tonight with epigastric pain, nausea and vomiting.      Past Medical History:  Diagnosis Date  . Dehydration 01/28/2016  . Environmental allergies   . Gastric ulcer dx'd 2016  . GERD (gastroesophageal reflux disease)   . Migraine    "a few/year" (01/28/2016)    Patient Active Problem List   Diagnosis Date Noted  . Epigastric abdominal pain   . Non-intractable cyclical vomiting with nausea   . Dehydration 01/28/2016    Class: Acute  . Abdominal pain 01/28/2016    Past Surgical History:  Procedure Laterality Date  . ESOPHAGOGASTRODUODENOSCOPY N/A 01/30/2016   Procedure: ESOPHAGOGASTRODUODENOSCOPY (EGD);  Surgeon: Napoleon Form, MD;  Location[facility] ENDOSCOPY;  Service: Endoscopy;  Laterality: N/A;  . NO PAST SURGERIES      OB History    No data available       Home Medications    Prior to Admission medications   Medication Sig Start Date End Date Taking? Authorizing Provider  alum & mag hydroxide-simeth (MAALOX/MYLANTA) 200-200-20 MG/5ML suspension Take 30 mLs by mouth every 6 (six) hours as needed for indigestion or heartburn. 03/26/16   Antony Madura, PA-C  EPIPEN 2-PAK 0.3 MG/0.3ML SOAJ injection Inject 1 application into the skin as needed (for allergic reaciton).  08/25/14   Historical Provider, MD  fluticasone (FLONASE) 50 MCG/ACT nasal spray Place 2 sprays  into the nose daily as needed for allergies.     Historical Provider, MD  loratadine (CLARITIN) 10 MG tablet Take 10 mg by mouth daily.    Historical Provider, MD  ondansetron (ZOFRAN ODT) 4 MG disintegrating tablet Take 1 tablet (4 mg total) by mouth every 8 (eight) hours as needed for nausea or vomiting. 05/26/16   Earley Favor, NP  oxyCODONE-acetaminophen (PERCOCET/ROXICET) 5-325 MG tablet Take 1 tablet by mouth every 4 (four) hours as needed for severe pain.    Historical Provider, MD  pantoprazole (PROTONIX) 20 MG tablet Take 1 tablet (20 mg total) by mouth 2 (two) times daily. Take 1 tab twice a day for 7 days than daily 05/26/16 06/02/16  Earley Favor, NP  pantoprazole (PROTONIX) 40 MG tablet Take 1 tablet (40 mg total) by mouth daily. 05/26/16   Earley Favor, NP  promethazine (PHENERGAN) 25 MG tablet Take 25 mg by mouth every 6 (six) hours as needed for nausea or vomiting.    Historical Provider, MD    Family History No family history on file.  Social History Social History  Substance Use Topics  . Smoking status: Never Smoker  . Smokeless tobacco: Never Used  . Alcohol use No     Allergies   Apple; Peanuts [peanut oil]; Nsaids; and Shrimp [shellfish allergy]   Review of Systems Review of Systems  Constitutional: Negative for fever.  Gastrointestinal: Positive for abdominal pain, nausea and vomiting.  All other systems reviewed  and are negative.    Physical Exam Updated Vital Signs BP (!) 95/57   Pulse 77   Temp 98 F (36.7 C)   Resp 16   LMP 05/04/2016   SpO2 100%   Physical Exam  Constitutional: She appears well-developed and well-nourished.  HENT:  Head: Normocephalic.  Eyes: Pupils are equal, round, and reactive to light.  Cardiovascular: Normal rate.   Pulmonary/Chest: Effort normal.  Abdominal: Soft. Bowel sounds are normal. She exhibits no distension. There is no tenderness.  Neurological: She is alert.  Skin: Skin is warm.  Psychiatric: She has a normal  mood and affect.  Nursing note and vitals reviewed.    ED Treatments / Results  Labs (all labs ordered are listed, but only abnormal results are displayed) Labs Reviewed  LIPASE, BLOOD - Abnormal; Notable for the following:       Result Value   Lipase <10 (*)    All other components within normal limits  COMPREHENSIVE METABOLIC PANEL - Abnormal; Notable for the following:    CO2 20 (*)    Glucose, Bld 112 (*)    All other components within normal limits  CBC - Abnormal; Notable for the following:    WBC 13.7 (*)    All other components within normal limits  URINALYSIS, ROUTINE W REFLEX MICROSCOPIC  I-STAT BETA HCG BLOOD, ED ([facility], [facility], [facility] ONLY)    EKG  EKG Interpretation None       Radiology No results found.  Procedures Procedures (including critical care time)  Medications Ordered in ED Medications  sodium chloride 0.9 % bolus 1,000 mL (1,000 mLs Intravenous New Bag/Given 05/26/16 0030)  ondansetron (ZOFRAN-ODT) disintegrating tablet 4 mg (4 mg Oral Given 05/26/16 0021)  sucralfate (CARAFATE) tablet 1 g (1 g Oral Given 05/26/16 0028)     Initial Impression / Assessment and Plan / ED Course  I have reviewed the triage vital signs and the nursing notes.  Pertinent labs & imaging results that were available during my care of the patient were reviewed by me and considered in my medical decision making (see chart for details).      A long talk with this patient about the importance of follow through, the need for her medication to be taken on a regular basis so that her esophagus can heel and is only can be done if she prevents the buildup of gastric juices.  I also recommend that she elevate the head of her bed slightly not eat for 2-3 hours before going to bed and she seems to understand better the pathology of her disease process at this time I will give her 1 L fluid, Zofran as an antiemetic and Carafate for symptom relief Patient is feeling significantly better.   She is able to tolerate fluids Final Clinical Impressions(s) / ED Diagnoses   Final diagnoses:  Gastroesophageal reflux disease, esophagitis presence not specified    New Prescriptions New Prescriptions   ONDANSETRON (ZOFRAN ODT) 4 MG DISINTEGRATING TABLET    Take 1 tablet (4 mg total) by mouth every 8 (eight) hours as needed for nausea or vomiting.   PANTOPRAZOLE (PROTONIX) 20 MG TABLET    Take 1 tablet (20 mg total) by mouth 2 (two) times daily. Take 1 tab twice a day for 7 days than daily     Earley Favor, NP 05/25/16 2344    Earley Favor, NP 05/26/16 1610    Lavera Guise, MD 05/27/16 [phone]

## 2016-05-25 NOTE — ED Triage Notes (Addendum)
Pt started vomiting at 0300 this morning.  Pt reports abdominal cramping then vomiting.    Pt is allergic to shrimp, but states she can eat it in small quantities.  She had fried shrimp last night.

## 2016-05-25 NOTE — ED Provider Notes (Signed)
CSN: [id number]     Arrival date & time 05/25/16  1759 History   First MD Initiated Contact with Patient 05/25/16 1825     Chief Complaint  Patient presents with  . Emesis   (Consider location/radiation/quality/duration/timing/severity/associated sxs/prior Treatment) Walked in room pt is not able to hold her head up, states that she has been vomiting "all day" not able to keep foods down. Pt has phenergan and a hx of this in the past but did not take anything. Pt is alert and oriented. Denies any fevers.  Has chronic cycling n/v and GERD. Denies any urinary sx, states that she ate shrimp yesterday and has an "slight" allergy to this but still eats it. Denies any sob, lungs clear. Offered to have an ambulance transport pt to the ER but she refused states that the person that brought her will take her. Pt walked into room from waiting area. No visible emesis while in room.       Past Medical History:  Diagnosis Date  . Dehydration 01/28/2016  . Environmental allergies   . Gastric ulcer dx'd 2016  . GERD (gastroesophageal reflux disease)   . Migraine    "a few/year" (01/28/2016)   Past Surgical History:  Procedure Laterality Date  . ESOPHAGOGASTRODUODENOSCOPY N/A 01/30/2016   Procedure: ESOPHAGOGASTRODUODENOSCOPY (EGD);  Surgeon: Napoleon Form, MD;  Location[facility] ENDOSCOPY;  Service: Endoscopy;  Laterality: N/A;  . NO PAST SURGERIES     History reviewed. No pertinent family history. Social History  Substance Use Topics  . Smoking status: Never Smoker  . Smokeless tobacco: Never Used  . Alcohol use No   OB History    No data available     Review of Systems  Constitutional: Positive for fatigue.  Respiratory: Negative.   Cardiovascular: Negative.   Gastrointestinal: Positive for nausea and vomiting.  Genitourinary: Negative.   Musculoskeletal: Negative for neck pain.  Skin: Negative.   Neurological: Negative.     Allergies  Apple; Peanuts [peanut oil]; Nsaids; and  Shrimp [shellfish allergy]  Home Medications   Prior to Admission medications   Medication Sig Start Date End Date Taking? Authorizing Provider  loratadine (CLARITIN) 10 MG tablet Take 10 mg by mouth daily.   Yes Historical Provider, MD  alum & mag hydroxide-simeth (MAALOX/MYLANTA) 200-200-20 MG/5ML suspension Take 30 mLs by mouth every 6 (six) hours as needed for indigestion or heartburn. 03/26/16   Antony Madura, PA-C  EPIPEN 2-PAK 0.3 MG/0.3ML SOAJ injection Inject 1 application into the skin as needed (for allergic reaciton).  08/25/14   Historical Provider, MD  fluticasone (FLONASE) 50 MCG/ACT nasal spray Place 2 sprays into the nose daily as needed for allergies.     Historical Provider, MD  oxyCODONE-acetaminophen (PERCOCET/ROXICET) 5-325 MG tablet Take 1 tablet by mouth every 4 (four) hours as needed for severe pain.    Historical Provider, MD  pantoprazole (PROTONIX) 40 MG tablet Take 1 tablet (40 mg total) by mouth daily. 03/26/16   Antony Madura, PA-C  promethazine (PHENERGAN) 25 MG tablet Take 25 mg by mouth every 6 (six) hours as needed for nausea or vomiting.    Historical Provider, MD   Meds Ordered and Administered this Visit  Medications - No data to display  BP (!) 89/60 (BP Location: Left Arm)   Pulse 87   Temp 98.3 F (36.8 C) (Oral)   LMP  (LMP Unknown)   SpO2 100%  No data found.   Physical Exam  Constitutional: She appears well-developed.  Sitting  on chair and talking to staff, eyes open, able to talk in full sentences.   HENT:  Head: Normocephalic.  Eyes: Pupils are equal, round, and reactive to light.  Neck: Normal range of motion.  Cardiovascular: Normal rate and regular rhythm.   Pulmonary/Chest: Effort normal and breath sounds normal.  Abdominal: Soft. Bowel sounds are normal.  Musculoskeletal: Normal range of motion.  Neurological: She is alert.  Skin: Skin is warm. Capillary refill takes less than 2 seconds.    Urgent Care Course     Procedures  (including critical care time)  Labs Review      MDM   1. Nausea vomiting and diarrhea   2. Dehydration    Go to [place] Offered transport pt would like to go via friend that brought her.  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 Brought meds with her and still not taking.  Pt has been seen for this same thing several times last time was 1 month ago.    Tobi Bastos, NP 05/25/16 [phone]

## 2016-05-25 NOTE — ED Triage Notes (Signed)
Pt states that she started vomiting today with upper abd pain, hx of the same and has been hospitalized several times for the same, followed by GI, reports vomit x  9 and diarrhea x 1 today, denies fevers.

## 2016-05-25 NOTE — ED Notes (Signed)
Pt unable to void at this time. 

## 2016-05-26 MED ORDER — PANTOPRAZOLE SODIUM 20 MG PO TBEC: 20.0000 mg | DELAYED_RELEASE_TABLET | Freq: Two times a day (BID) | ORAL | 1 refills | Status: DC

## 2016-05-26 MED ORDER — ONDANSETRON 4 MG PO TBDP: 4.0000 mg | ORAL_TABLET | Freq: Three times a day (TID) | ORAL | 0 refills | Status: DC | PRN

## 2016-05-26 MED ORDER — PANTOPRAZOLE SODIUM 40 MG PO TBEC: 40.0000 mg | DELAYED_RELEASE_TABLET | Freq: Every day | ORAL | 0 refills | Status: DC

## 2016-05-26 NOTE — ED Notes (Signed)
Pt verbalized understanding discharge instructions and denies any further needs or questions at this time. VS stable, ambulatory and steady gait.   

## 2016-05-26 NOTE — Discharge Instructions (Signed)
As discussed.  I also recommend raising the head of the bed approximately 4-6 inches.  Try to avoid eating meals for at least 2 hours before going to bed at night for the next week urology take 1 tablet twice a day and then 1 tablet daily.  Follow-up with your primary care physician

## 2016-05-27 ENCOUNTER — Encounter (HOSPITAL_COMMUNITY): Payer: Self-pay | Admitting: Family Medicine

## 2016-05-27 ENCOUNTER — Emergency Department (HOSPITAL_COMMUNITY)
Admission: EM | Admit: 2016-05-27 | Discharge: 2016-05-27 | Disposition: A | Discharge status: Home / Self Care | Payer: Managed Care, Other (non HMO) | Attending: Emergency Medicine | Admitting: Emergency Medicine

## 2016-05-27 DIAGNOSIS — Z91010 Allergy to peanuts: Secondary | ICD-10-CM | POA: Insufficient documentation

## 2016-05-27 DIAGNOSIS — R112 Nausea with vomiting, unspecified: Secondary | ICD-10-CM

## 2016-05-27 LAB — COMPREHENSIVE METABOLIC PANEL
ALK PHOS: 45 U/L (ref 38–126)
ALT: 15 U/L (ref 14–54)
ANION GAP: 7 (ref 5–15)
AST: 18 U/L (ref 15–41)
Albumin: 4.4 g/dL (ref 3.5–5.0)
BUN: 11 mg/dL (ref 6–20)
CALCIUM: 8.9 mg/dL (ref 8.9–10.3)
CO2: 22 mmol/L (ref 22–32)
CREATININE: 0.66 mg/dL (ref 0.44–1.00)
Chloride: 106 mmol/L (ref 101–111)
Glucose, Bld: 91 mg/dL (ref 65–99)
Potassium: 3.7 mmol/L (ref 3.5–5.1)
Sodium: 135 mmol/L (ref 135–145)
TOTAL PROTEIN: 7.1 g/dL (ref 6.5–8.1)
Total Bilirubin: 1.2 mg/dL (ref 0.3–1.2)

## 2016-05-27 LAB — URINALYSIS, ROUTINE W REFLEX MICROSCOPIC
BILIRUBIN URINE: NEGATIVE
GLUCOSE, UA: NEGATIVE mg/dL
HGB URINE DIPSTICK: NEGATIVE
KETONES UR: 80 mg/dL — AB
NITRITE: NEGATIVE
PH: 6 (ref 5.0–8.0)
Protein, ur: NEGATIVE mg/dL
Specific Gravity, Urine: 1.027 (ref 1.005–1.030)

## 2016-05-27 LAB — CBC
HCT: 35.2 % — ABNORMAL LOW (ref 36.0–46.0)
Hemoglobin: 11.9 g/dL — ABNORMAL LOW (ref 12.0–15.0)
MCH: 30.7 pg (ref 26.0–34.0)
MCHC: 33.8 g/dL (ref 30.0–36.0)
MCV: 91 fL (ref 78.0–100.0)
PLATELETS: 262 10*3/uL (ref 150–400)
RBC: 3.87 MIL/uL (ref 3.87–5.11)
RDW: 13.2 % (ref 11.5–15.5)
WBC: 11.2 10*3/uL — AB (ref 4.0–10.5)

## 2016-05-27 LAB — POC URINE PREG, ED: Preg Test, Ur: NEGATIVE

## 2016-05-27 LAB — LIPASE, BLOOD: LIPASE: 17 U/L (ref 11–51)

## 2016-05-27 MED ORDER — SODIUM CHLORIDE 0.9 % IV BOLUS (SEPSIS)
1000.0000 mL | Freq: Once | INTRAVENOUS | Status: AC
  Administered 2016-05-27: 1000 mL via INTRAVENOUS

## 2016-05-27 MED ORDER — ONDANSETRON HCL 4 MG/2ML IJ SOLN
4.0000 mg | Freq: Once | INTRAMUSCULAR | Status: AC
  Administered 2016-05-27: 4 mg via INTRAVENOUS
  Filled 2016-05-27: qty 2

## 2016-05-27 MED ORDER — PANTOPRAZOLE SODIUM 40 MG IV SOLR
40.0000 mg | Freq: Once | INTRAVENOUS | Status: AC
  Administered 2016-05-27: 40 mg via INTRAVENOUS
  Filled 2016-05-27: qty 40

## 2016-05-27 NOTE — ED Triage Notes (Signed)
Patient is complaining of epigastric pain. Pt was seen at [facility] ED 2 days ago and diagnosed with GASTRITIS. Pt's family member reports she has vomited muiltple times but has not vomited in the emergency department.

## 2016-05-27 NOTE — ED Provider Notes (Signed)
[facility]-EMERGENCY DEPT Provider Note   CSN: [id number] Arrival date & time: 05/27/16  0138     History   Chief Complaint Chief Complaint  Patient presents with  . Abdominal Pain    HPI Nancy Vaughan is a 21 y.o. female.  Patient with history of erosive esophagitis diagnosed on previous endoscopy (12/17), GERD, intermittent episodes of vomiting and upper abdominal pain several times per year, recent ED visit on 05/25/16 for the same -- presents with complaint of upper abdominal pain and vomiting. Last episode of vomiting just prior to arrival. Was nonbloody, nonbilious. No fevers, chest pain, shortness of breath. No urinary symptoms or diarrhea. Parents at bedside reports symptoms are typical. Patient felt better after hospital visit last night. They feel that she excluded her diet too quickly causing relapse of symptoms today. She has been prescribed Protonix and Zofran with minimal relief. No heavy NSAID or alcohol use. No history of abdominal surgeries.      Past Medical History:  Diagnosis Date  . Dehydration 01/28/2016  . Environmental allergies   . Gastric ulcer dx'd 2016  . GERD (gastroesophageal reflux disease)   . Migraine    "a few/year" (01/28/2016)    Patient Active Problem List   Diagnosis Date Noted  . Epigastric abdominal pain   . Non-intractable cyclical vomiting with nausea   . Dehydration 01/28/2016    Class: Acute  . Abdominal pain 01/28/2016    Past Surgical History:  Procedure Laterality Date  . ESOPHAGOGASTRODUODENOSCOPY N/A 01/30/2016   Procedure: ESOPHAGOGASTRODUODENOSCOPY (EGD);  Surgeon: Napoleon Form, MD;  Location: [facility] ENDOSCOPY;  Service: Endoscopy;  Laterality: N/A;  . NO PAST SURGERIES      OB History    No data available       Home Medications    Prior to Admission medications   Medication Sig Start Date End Date Taking? Authorizing Provider  alum & mag hydroxide-simeth (MAALOX/MYLANTA) 200-200-20 MG/5ML suspension Take 30 mLs  by mouth every 6 (six) hours as needed for indigestion or heartburn. 03/26/16   Antony Madura, PA-C  EPIPEN 2-PAK 0.3 MG/0.3ML SOAJ injection Inject 1 application into the skin as needed (for allergic reaciton).  08/25/14   Historical Provider, MD  fluticasone (FLONASE) 50 MCG/ACT nasal spray Place 2 sprays into the nose daily as needed for allergies.     Historical Provider, MD  loratadine (CLARITIN) 10 MG tablet Take 10 mg by mouth daily.    Historical Provider, MD  ondansetron (ZOFRAN ODT) 4 MG disintegrating tablet Take 1 tablet (4 mg total) by mouth every 8 (eight) hours as needed for nausea or vomiting. 05/26/16   Earley Favor, NP  oxyCODONE-acetaminophen (PERCOCET/ROXICET) 5-325 MG tablet Take 1 tablet by mouth every 4 (four) hours as needed for severe pain.    Historical Provider, MD  pantoprazole (PROTONIX) 20 MG tablet Take 1 tablet (20 mg total) by mouth 2 (two) times daily. Take 1 tab twice a day for 7 days than daily 05/26/16 06/02/16  Earley Favor, NP  pantoprazole (PROTONIX) 40 MG tablet Take 1 tablet (40 mg total) by mouth daily. 05/26/16   Earley Favor, NP  promethazine (PHENERGAN) 25 MG tablet Take 25 mg by mouth every 6 (six) hours as needed for nausea or vomiting.    Historical Provider, MD    Family History History reviewed. No pertinent family history.  Social History Social History  Substance Use Topics  . Smoking status: Never Smoker  . Smokeless tobacco: Never Used  . Alcohol use No  Allergies   Apple; Peanuts [peanut oil]; Nsaids; and Shrimp [shellfish allergy]   Review of Systems Review of Systems  Constitutional: Negative for fever.  HENT: Negative for rhinorrhea and sore throat.   Eyes: Negative for redness.  Respiratory: Negative for cough.   Cardiovascular: Negative for chest pain.  Gastrointestinal: Positive for abdominal pain, nausea and vomiting. Negative for blood in stool and diarrhea.  Genitourinary: Negative for dysuria.  Musculoskeletal: Negative for  myalgias.  Skin: Negative for rash.  Neurological: Negative for headaches.     Physical Exam Updated Vital Signs BP (!) 95/44 (BP Location: Right Arm)   Pulse (!) 52   Temp 98.2 F (36.8 C) (Oral)   Resp 15   Ht  (1.6 m)   Wt 48.5 kg   LMP  (LMP Unknown) Comment: Last Month  SpO2 99%   BMI 18.95 kg/m   Physical Exam  Constitutional: She appears well-developed and well-nourished.  HENT:  Head: Normocephalic and atraumatic.  Slightly dry mucous membranes.  Eyes: Conjunctivae are normal. Right eye exhibits no discharge. Left eye exhibits no discharge.  Neck: Normal range of motion. Neck supple.  Cardiovascular: Normal rate, regular rhythm and normal heart sounds.   Pulmonary/Chest: Effort normal and breath sounds normal. No respiratory distress. She has no wheezes. She has no rales.  Abdominal: Soft. There is tenderness (mild, epigastrium). There is no rebound and no guarding.  Neurological: She is alert.  Skin: Skin is warm and dry.  Psychiatric: She has a normal mood and affect.  Nursing note and vitals reviewed.    ED Treatments / Results  Labs (all labs ordered are listed, but only abnormal results are displayed) Labs Reviewed  CBC - Abnormal; Notable for the following:       Result Value   WBC 11.2 (*)    Hemoglobin 11.9 (*)    HCT 35.2 (*)    All other components within normal limits  URINALYSIS, ROUTINE W REFLEX MICROSCOPIC - Abnormal; Notable for the following:    APPearance CLOUDY (*)    Ketones, ur 80 (*)    Leukocytes, UA TRACE (*)    Bacteria, UA RARE (*)    Squamous Epithelial / LPF 6-30 (*)    All other components within normal limits  LIPASE, BLOOD  COMPREHENSIVE METABOLIC PANEL  POC URINE PREG, ED    Procedures Procedures (including critical care time)  Medications Ordered in ED Medications  sodium chloride 0.9 % bolus 1,000 mL (0 mLs Intravenous Stopped 05/27/16 0544)  pantoprazole (PROTONIX) injection 40 mg (40 mg Intravenous Given  05/27/16 0414)  ondansetron (ZOFRAN) injection 4 mg (4 mg Intravenous Given 05/27/16 0410)     Initial Impression / Assessment and Plan / ED Course  I have reviewed the triage vital signs and the nursing notes.  Pertinent labs & imaging results that were available during my care of the patient were reviewed by me and considered in my medical decision making (see chart for details).     Patient seen and examined. Work-up initiated. Medications ordered. Initial workup shows evidence of dehydration. The symptoms are typical for her previous episodes, do not feel imaging is indicated at this time. Lab work reviewed and is reassuring. Will recheck as review with IV fluids, antiemetics, Protonix. I reviewed recent Endo report.   Vital signs reviewed and are as follows: BP (!) 95/44 (BP Location: Right Arm)   Pulse (!) 52   Temp 98.2 F (36.8 C) (Oral)   Resp 15  Ht  (1.6 m)   Wt 48.5 kg   LMP  (LMP Unknown) Comment: Last Month  SpO2 99%   BMI 18.95 kg/m   6:42 AM patient stable during ED stay. She is feeling somewhat better. She has received IV fluids. No further vomiting. She has taken some sips of water. Parents are comfortable with discharge to home at this time. They will ensure that the patient adheres to a clear liquid diet. Typically she needs to do this for 2-3 days until symptoms improve. She will continue antiemetics and Protonix previously prescribed. Encouraged PCP/GI follow-up as needed.  The patient was urged to return to the Emergency Department immediately with worsening of current symptoms, worsening abdominal pain, persistent vomiting, blood noted in stools, fever, or any other concerns. The patient verbalized understanding.    Final Clinical Impressions(s) / ED Diagnoses   Final diagnoses:  Non-intractable vomiting with nausea, unspecified vomiting type   Patient with history of erosive esophagitis, intermittent flares of this, current symptoms typical of  previous. Labs are reassuring. She does have some signs of dehydration. Patient treated in emergency department with 2 L of IV normal saline. She has not vomited here. Patient's parents have been through this multiple times. They know what help her and seems reliable to return if patient develops symptoms that are atypical. Abdomen remains soft and minimally tender during ED stay.  Vitals are stable, no fever. Labs reassuring. Imaging do not feel indicated at this time. Pt tolerating POs here. Lungs are clear and no signs suggestive of PNA. Low concern for appendicitis, cholecystitis, pancreatitis, ruptured viscus, UTI, kidney stone, aortic dissection, aortic aneurysm or other emergent abdominal etiology. Supportive therapy indicated with return if symptoms worsen.    New Prescriptions Discharge Medication List as of 05/27/2016  6:27 AM       Renne Crigler, PA-C 05/27/16 9604    Pricilla Loveless, MD 05/27/16 [phone]

## 2016-05-27 NOTE — ED Notes (Signed)
Fluids given by provider

## 2016-05-27 NOTE — Discharge Instructions (Signed)
Please read and follow all provided instructions.  Your diagnoses today include:  1. Non-intractable vomiting with nausea, unspecified vomiting type    Tests performed today include:  Blood counts and electrolytes  Blood tests to check liver and kidney function  Blood tests to check pancreas function  Urine test to look for infection and pregnancy (in women)  Vital signs. See below for your results today.   Medications prescribed:   None  Take any prescribed medications only as directed.  Home care instructions:   Follow any educational materials contained in this packet.   Drink clear liquids for the next 24 hours and introduce solid foods slowly after 24 hours using the b.r.a.t. diet (Bananas, Rice, Applesauce, Toast, Yogurt).    Follow-up instructions: Please follow-up with your primary care provider in the next 2 days for further evaluation of your symptoms. If you are not feeling better in 48 hours you may have a condition that is more serious and you need re-evaluation.   Return instructions:  SEEK IMMEDIATE MEDICAL ATTENTION IF:  If you have pain that does not go away or becomes severe   A temperature above 101F develops   Repeated vomiting occurs (multiple episodes)   If you have pain that becomes localized to portions of the abdomen. The right side could possibly be appendicitis. In an adult, the left lower portion of the abdomen could be colitis or diverticulitis.   Blood is being passed in stools or vomit (bright red or black tarry stools)   You develop chest pain, difficulty breathing, dizziness or fainting, or become confused, poorly responsive, or inconsolable (young children)  If you have any other emergent concerns regarding your health  Additional Information: Abdominal (belly) pain can be caused by many things. Your caregiver performed an examination and possibly ordered blood/urine tests and imaging (CT scan, x-rays, ultrasound). Many cases can be  observed and treated at home after initial evaluation in the emergency department. Even though you are being discharged home, abdominal pain can be unpredictable. Therefore, you need a repeated exam if your pain does not resolve, returns, or worsens. Most patients with abdominal pain don't have to be admitted to the hospital or have surgery, but serious problems like appendicitis and gallbladder attacks can start out as nonspecific pain. Many abdominal conditions cannot be diagnosed in one visit, so follow-up evaluations are very important.  Your vital signs today were: BP (!) 101/58 (BP Location: Right Arm)    Pulse 63    Temp 98.2 F (36.8 C) (Oral)    Resp 15    Ht  (1.6 m)    Wt 48.5 kg    LMP  (LMP Unknown) Comment: Last Month   SpO2 96%    BMI 18.95 kg/m  If your blood pressure (bp) was elevated above 135/85 this visit, please have this repeated by your doctor within one month. --------------

## 2017-06-28 ENCOUNTER — Emergency Department (HOSPITAL_COMMUNITY)
Admission: EM | Admit: 2017-06-28 | Discharge: 2017-06-29 | Disposition: A | Discharge status: Home / Self Care | Payer: Managed Care, Other (non HMO) | Attending: Emergency Medicine | Admitting: Emergency Medicine

## 2017-06-28 ENCOUNTER — Other Ambulatory Visit: Payer: Self-pay

## 2017-06-28 ENCOUNTER — Encounter (HOSPITAL_COMMUNITY): Payer: Self-pay | Admitting: Emergency Medicine

## 2017-06-28 DIAGNOSIS — E860 Dehydration: Secondary | ICD-10-CM

## 2017-06-28 DIAGNOSIS — R109 Unspecified abdominal pain: Secondary | ICD-10-CM | POA: Diagnosis not present

## 2017-06-28 DIAGNOSIS — R112 Nausea with vomiting, unspecified: Secondary | ICD-10-CM | POA: Diagnosis not present

## 2017-06-28 LAB — COMPREHENSIVE METABOLIC PANEL
ALT: 15 U/L (ref 14–54)
ANION GAP: 9 (ref 5–15)
AST: 20 U/L (ref 15–41)
Albumin: 4.4 g/dL (ref 3.5–5.0)
Alkaline Phosphatase: 56 U/L (ref 38–126)
BUN: 10 mg/dL (ref 6–20)
CHLORIDE: 107 mmol/L (ref 101–111)
CO2: 21 mmol/L — AB (ref 22–32)
Calcium: 9.5 mg/dL (ref 8.9–10.3)
Creatinine, Ser: 0.66 mg/dL (ref 0.44–1.00)
GFR calc non Af Amer: >60 mL/min (ref >60)
Glucose, Bld: 113 mg/dL — ABNORMAL HIGH (ref 65–99)
POTASSIUM: 3.9 mmol/L (ref 3.5–5.1)
SODIUM: 137 mmol/L (ref 135–145)
Total Bilirubin: 1.1 mg/dL (ref 0.3–1.2)
Total Protein: 7.7 g/dL (ref 6.5–8.1)

## 2017-06-28 LAB — CBC
HEMATOCRIT: 38.3 % (ref 36.0–46.0)
HEMOGLOBIN: 12.7 g/dL (ref 12.0–15.0)
MCH: 30.8 pg (ref 26.0–34.0)
MCHC: 33.2 g/dL (ref 30.0–36.0)
MCV: 93 fL (ref 78.0–100.0)
Platelets: 273 10*3/uL (ref 150–400)
RBC: 4.12 MIL/uL (ref 3.87–5.11)
RDW: 13.3 % (ref 11.5–15.5)
WBC: 10.8 10*3/uL — ABNORMAL HIGH (ref 4.0–10.5)

## 2017-06-28 LAB — LIPASE, BLOOD: LIPASE: 20 U/L (ref 11–51)

## 2017-06-28 LAB — I-STAT BETA HCG BLOOD, ED (MC, WL, AP ONLY): I-stat hCG, quantitative: <5 m[IU]/mL (ref <5)

## 2017-06-28 MED ORDER — ONDANSETRON 4 MG PO TBDP
4.0000 mg | ORAL_TABLET | Freq: Once | ORAL | Status: AC | PRN
  Administered 2017-06-28: 4 mg via ORAL
  Filled 2017-06-28: qty 1

## 2017-06-28 MED ORDER — DICYCLOMINE HCL 10 MG/ML IM SOLN
20.0000 mg | Freq: Once | INTRAMUSCULAR | Status: AC
Start: 2017-06-28 — End: 2017-06-29
  Administered 2017-06-29: 20 mg via INTRAMUSCULAR
  Filled 2017-06-28: qty 2

## 2017-06-28 MED ORDER — ONDANSETRON HCL 4 MG/2ML IJ SOLN
4.0000 mg | Freq: Once | INTRAMUSCULAR | Status: AC
  Administered 2017-06-29: 4 mg via INTRAVENOUS
  Filled 2017-06-28: qty 2

## 2017-06-28 MED ORDER — SODIUM CHLORIDE 0.9 % IV BOLUS
1000.0000 mL | Freq: Once | INTRAVENOUS | Status: AC
  Administered 2017-06-29: 1000 mL via INTRAVENOUS

## 2017-06-28 NOTE — ED Provider Notes (Addendum)
TIME SEEN: 11:31 PM  CHIEF COMPLAINT: Abdominal pain, vomiting  HPI: Patient is a 22 year old female with history of migraines, GERD, who presents to the emergency department with diffuse crampy abdominal pain for the past day with nausea vomiting.  No diarrhea, bloody stool or melena, dysuria hematuria, vaginal bleeding or discharge.  Has had this recurrently.  Has a primary care physician but states she does not have a gastroenterologist.  Denies marijuana use.  No previous abdominal surgery.  No sick contacts or recent travel.  States she feels like she has been hot and cold but no documented fevers.  No aggravating or alleviating factors.  ROS: See HPI Constitutional: no fever  Eyes: no drainage  ENT: no runny nose   Cardiovascular:  no chest pain  Resp: no SOB  GI:  vomiting GU: no dysuria Integumentary: no rash  Allergy: no hives  Musculoskeletal: no leg swelling  Neurological: no slurred speech ROS otherwise negative  PAST MEDICAL HISTORY/PAST SURGICAL HISTORY:  Past Medical History:  Diagnosis Date  . Dehydration 01/28/2016  . Environmental allergies   . Gastric ulcer dx'd 2016  . GERD (gastroesophageal reflux disease)   . Migraine    "a few/year" (01/28/2016)    MEDICATIONS:  Prior to Admission medications   Medication Sig Start Date End Date Taking? Authorizing Provider  alum & mag hydroxide-simeth (MAALOX/MYLANTA) 200-200-20 MG/5ML suspension Take 30 mLs by mouth every 6 (six) hours as needed for indigestion or heartburn. 03/26/16   Antony Madura, PA-C  EPIPEN 2-PAK 0.3 MG/0.3ML SOAJ injection Inject 1 application into the skin as needed (for allergic reaciton).  08/25/14   [provider]  fluticasone (FLONASE) 50 MCG/ACT nasal spray Place 2 sprays into the nose daily as needed for allergies.     [provider]  loratadine (CLARITIN) 10 MG tablet Take 10 mg by mouth daily.    [provider]  ondansetron (ZOFRAN ODT) 4 MG disintegrating  tablet Take 1 tablet (4 mg total) by mouth every 8 (eight) hours as needed for nausea or vomiting. 05/26/16   Earley Favor, NP  oxyCODONE-acetaminophen (PERCOCET/ROXICET) 5-325 MG tablet Take 1 tablet by mouth every 4 (four) hours as needed for severe pain.    [provider]  pantoprazole (PROTONIX) 20 MG tablet Take 1 tablet (20 mg total) by mouth 2 (two) times daily. Take 1 tab twice a day for 7 days than daily 05/26/16 06/02/16  Earley Favor, NP  pantoprazole (PROTONIX) 40 MG tablet Take 1 tablet (40 mg total) by mouth daily. 05/26/16   Earley Favor, NP  promethazine (PHENERGAN) 25 MG tablet Take 25 mg by mouth every 6 (six) hours as needed for nausea or vomiting.    [provider]    ALLERGIES:  Allergies  Allergen Reactions  . Apple Shortness Of Breath and Itching    Itchy throat  . Peanuts [Peanut Oil] Itching and Other (See Comments)    Itchy throat and sneezing   . Nsaids Nausea And Vomiting and Other (See Comments)    Flares her ulcers (CANNOT TAKE)  . Shrimp [Shellfish Allergy] Swelling and Other (See Comments)    Congestion (Can tolerate in a limited amount or will have to counteract with Benadryl)    SOCIAL HISTORY:  Social History   Tobacco Use  . Smoking status: Never Smoker  . Smokeless tobacco: Never Used  Substance Use Topics  . Alcohol use: No    FAMILY HISTORY: History reviewed. No pertinent family history.  EXAM: BP Marland Kitchen)  91/55 (BP Location: Left Arm)   Pulse (!) 59   Temp 98.1 F (36.7 C) (Oral)   Resp (!) 21   Ht  (1.6 m)   Wt 50.3 kg (111 lb)   LMP 06/26/2017   SpO2 100%   BMI 19.66 kg/m  CONSTITUTIONAL: Alert and oriented and responds appropriately to questions. Well-appearing; well-nourished HEAD: Normocephalic EYES: Conjunctivae clear, pupils appear equal, EOMI ENT: normal nose; dry mucous membranes NECK: Supple, no meningismus, no nuchal rigidity, no LAD  CARD: RRR; S1 and S2 appreciated; no murmurs, no clicks, no rubs,  no gallops RESP: Normal chest excursion without splinting or tachypnea; breath sounds clear and equal bilaterally; no wheezes, no rhonchi, no rales, no hypoxia or respiratory distress, speaking full sentences ABD/GI: Normal bowel sounds; non-distended; soft, mildly tender diffusely throughout the abdomen, no rebound, no guarding, no peritoneal signs, no hepatosplenomegaly BACK:  The back appears normal and is non-tender to palpation, there is no CVA tenderness EXT: Normal ROM in all joints; non-tender to palpation; no edema; normal capillary refill; no cyanosis, no calf tenderness or swelling    SKIN: Normal color for age and race; warm; no rash NEURO: Moves all extremities equally PSYCH: The patient's mood and manner are appropriate. Grooming and personal hygiene are appropriate.  MEDICAL DECISION MAKING: Patient here with diffuse abdominal pain.  Has been vomiting.  Has history of the same.  Abdominal exam is relatively benign.  Doubt colitis, diverticulitis, appendicitis, bowel obstruction, pancreatitis, cholecystitis.  Could be related to GERD, gastritis, possible gastroparesis.  Will check urine drug screen to evaluate for possible cannabinoid hyperemesis syndrome.  We will treat symptomatically with IV fluids, Zofran, Bentyl.  ED PROGRESS: Patient's labs are unremarkable.  Drug screen positive for opiates but no marijuana.  She reports feeling better.  Urine does show large ketones consistent with dehydration.  Given 2 L of IV fluids.  Tolerating oral fluids.  Will be reassessed after IV fluids complete.  Blood pressure improving.  Normal heart rate.   At this time, I do not feel there is any life-threatening condition present. I have reviewed and discussed all results (EKG, imaging, lab, urine as appropriate) and exam findings with patient/family. I have reviewed nursing notes and appropriate previous records.  I feel the patient is safe to be discharged home without further emergent workup and  can continue workup as an outpatient as needed. Discussed usual and customary return precautions. Patient/family verbalize understanding and are comfortable with this plan.  Outpatient follow-up has been provided if needed. All questions have been answered.      Jamontae Thwaites, Layla Maw, DO 06/29/17 0219   3:05 AM  Pt began vomiting again just prior to discharge.  Will give dose of IV Phenergan and repeat p.o. Challenge.   4:10 AM  Pt no longer vomiting.  Encouraged outpatient GI follow-up and bland diet.    Alec Mcphee, Layla Maw, DO 06/29/17 [phone]

## 2017-06-28 NOTE — ED Triage Notes (Signed)
Patient is complaining of abdominal pain. Patient had vicodin and zantac at home. Patient has been nauseated and vomiting. Patients vomit is green.

## 2017-06-29 LAB — URINALYSIS, ROUTINE W REFLEX MICROSCOPIC
BACTERIA UA: NONE SEEN
Bilirubin Urine: NEGATIVE
GLUCOSE, UA: NEGATIVE mg/dL
HGB URINE DIPSTICK: NEGATIVE
KETONES UR: 80 mg/dL — AB
Leukocytes, UA: NEGATIVE
Nitrite: NEGATIVE
PROTEIN: 30 mg/dL — AB
Specific Gravity, Urine: 1.029 (ref 1.005–1.030)
pH: 7 (ref 5.0–8.0)

## 2017-06-29 LAB — RAPID URINE DRUG SCREEN, HOSP PERFORMED
Amphetamines: NOT DETECTED
BENZODIAZEPINES: NOT DETECTED
Barbiturates: NOT DETECTED
Cocaine: NOT DETECTED
Opiates: POSITIVE — AB
Tetrahydrocannabinol: NOT DETECTED

## 2017-06-29 MED ORDER — ONDANSETRON 4 MG PO TBDP: 4.0000 mg | ORAL_TABLET | Freq: Four times a day (QID) | ORAL | 0 refills | Status: DC | PRN

## 2017-06-29 MED ORDER — PROMETHAZINE HCL 25 MG/ML IJ SOLN
25.0000 mg | Freq: Once | INTRAMUSCULAR | Status: AC
  Administered 2017-06-29: 25 mg via INTRAVENOUS
  Filled 2017-06-29: qty 1

## 2017-06-29 MED ORDER — DICYCLOMINE HCL 20 MG PO TABS: 20.0000 mg | ORAL_TABLET | Freq: Three times a day (TID) | ORAL | 0 refills | Status: DC | PRN

## 2017-06-29 NOTE — ED Notes (Signed)
Pt refused fluids at this time

## 2017-09-27 IMAGING — CR DG ANKLE COMPLETE 3+V*L*
3 series · 3 of 3 positions shown · non-contrast
Comparison: None

CLINICAL DATA: Lateral LEFT ankle pain for 1 week, no injury

EXAM:
LEFT ANKLE COMPLETE - 3+ VIEW

[t ankle joint ap left]
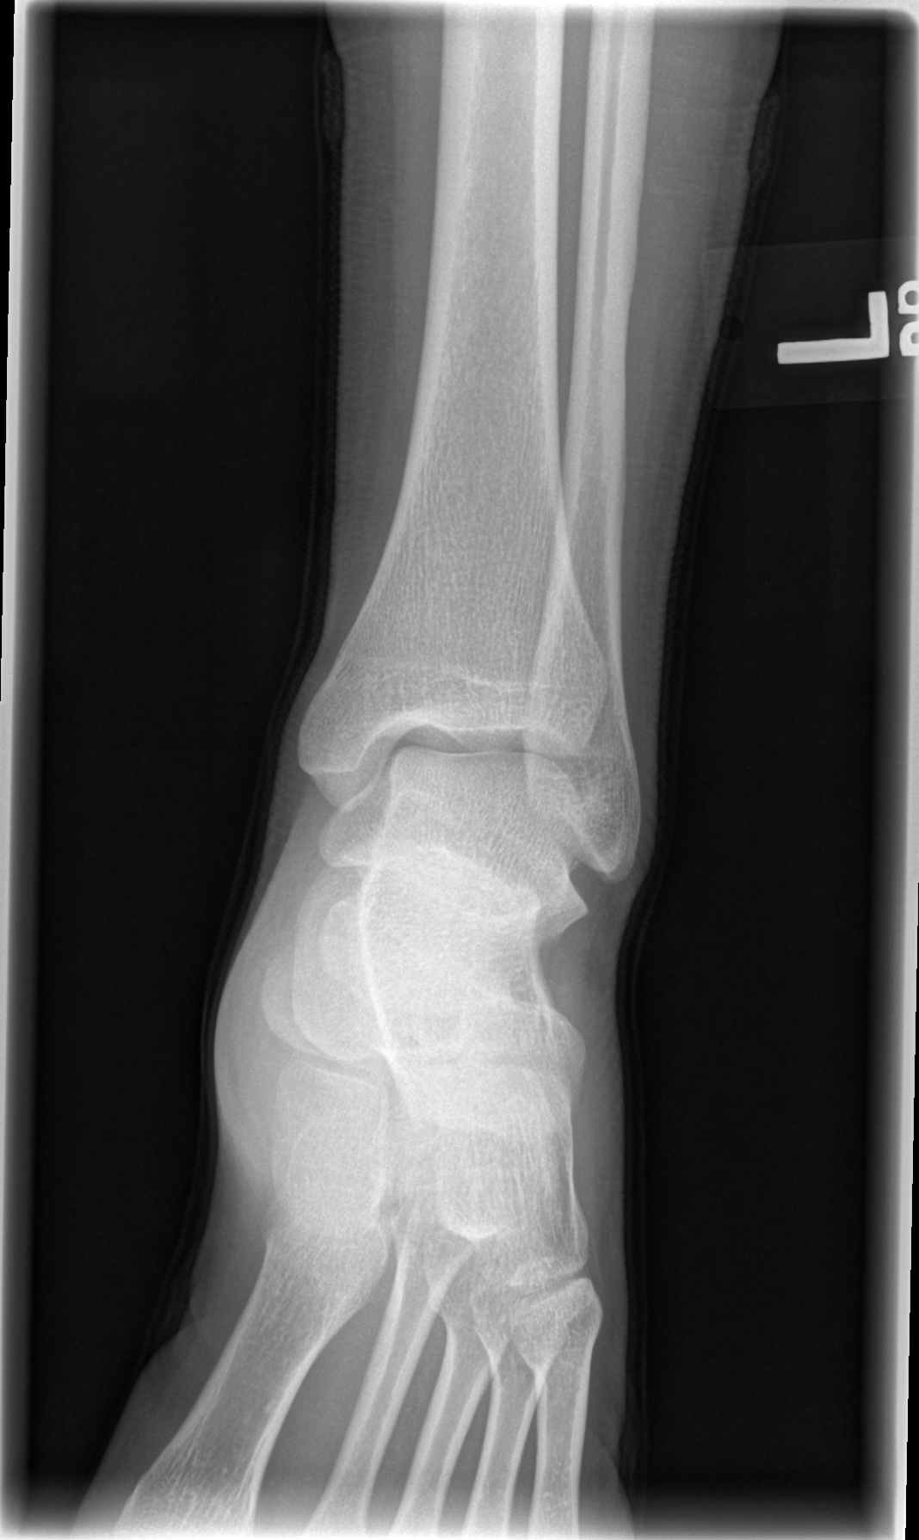

[t ankle joint oblique left]
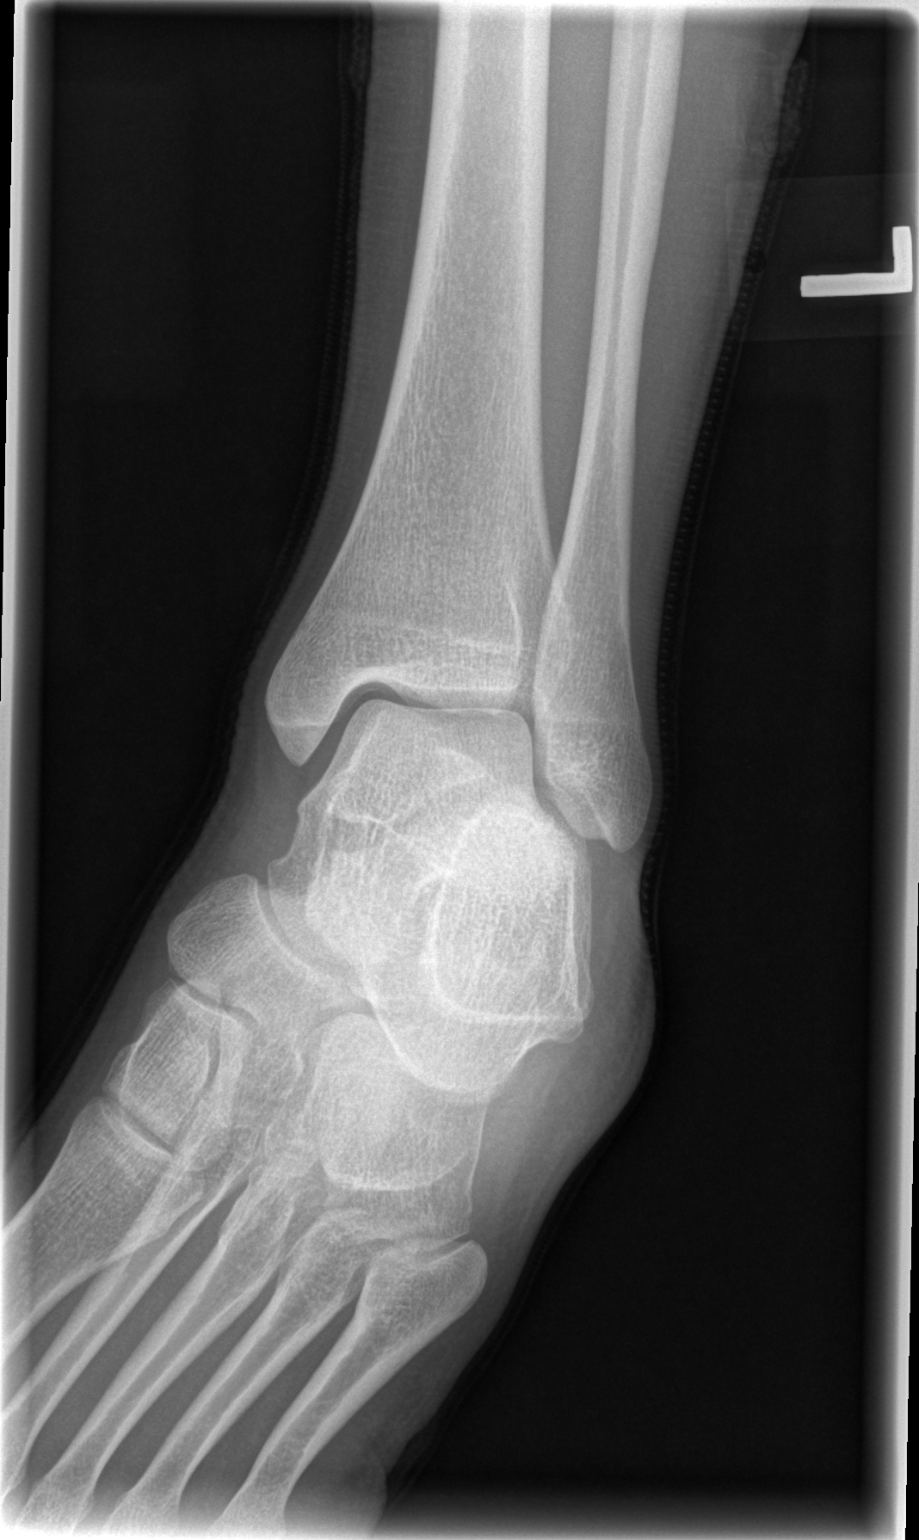

[t ankle joint lat left]
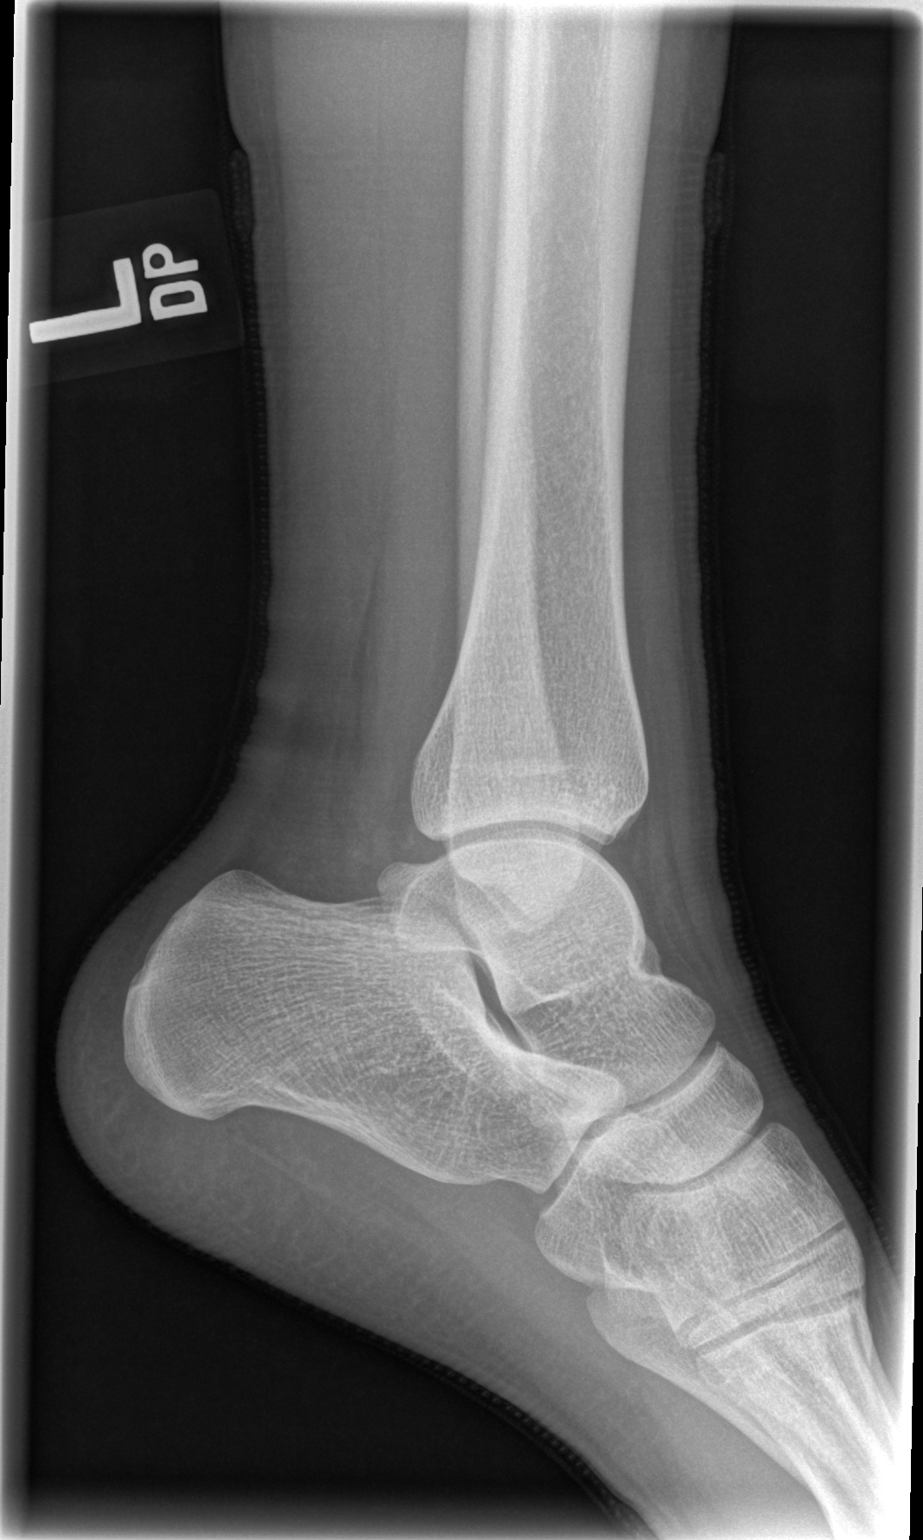

[3 of 3 positions shown; findings below may reference images not displayed]

FINDINGS: Osseous mineralization normal.

Joint spaces preserved.

No fracture, dislocation, or bone destruction.
IMPRESSION: Normal exam.

## 2018-02-14 ENCOUNTER — Emergency Department (HOSPITAL_COMMUNITY)
Admission: EM | Admit: 2018-02-14 | Discharge: 2018-02-14 | Disposition: A | Discharge status: Home / Self Care | Payer: Managed Care, Other (non HMO) | Attending: Emergency Medicine | Admitting: Emergency Medicine

## 2018-02-14 ENCOUNTER — Encounter (HOSPITAL_COMMUNITY): Payer: Self-pay | Admitting: Emergency Medicine

## 2018-02-14 ENCOUNTER — Other Ambulatory Visit: Payer: Self-pay

## 2018-02-14 DIAGNOSIS — Z91010 Allergy to peanuts: Secondary | ICD-10-CM | POA: Diagnosis not present

## 2018-02-14 DIAGNOSIS — R1084 Generalized abdominal pain: Secondary | ICD-10-CM

## 2018-02-14 DIAGNOSIS — R1010 Upper abdominal pain, unspecified: Secondary | ICD-10-CM | POA: Diagnosis present

## 2018-02-14 DIAGNOSIS — Z79899 Other long term (current) drug therapy: Secondary | ICD-10-CM | POA: Diagnosis not present

## 2018-02-14 LAB — URINALYSIS, ROUTINE W REFLEX MICROSCOPIC
BILIRUBIN URINE: NEGATIVE
Glucose, UA: NEGATIVE mg/dL
KETONES UR: 20 mg/dL — AB
Leukocytes, UA: NEGATIVE
NITRITE: NEGATIVE
PH: 8 (ref 5.0–8.0)
PROTEIN: NEGATIVE mg/dL
Specific Gravity, Urine: 1.02 (ref 1.005–1.030)

## 2018-02-14 LAB — CBC
HCT: 36.4 % (ref 36.0–46.0)
HEMOGLOBIN: 11.6 g/dL — AB (ref 12.0–15.0)
MCH: 29.7 pg (ref 26.0–34.0)
MCHC: 31.9 g/dL (ref 30.0–36.0)
MCV: 93.1 fL (ref 80.0–100.0)
Platelets: 239 10*3/uL (ref 150–400)
RBC: 3.91 MIL/uL (ref 3.87–5.11)
RDW: 13.1 % (ref 11.5–15.5)
WBC: 13 10*3/uL — AB (ref 4.0–10.5)
nRBC: 0 % (ref 0.0–0.2)

## 2018-02-14 LAB — COMPREHENSIVE METABOLIC PANEL
ALBUMIN: 4.5 g/dL (ref 3.5–5.0)
ALT: 14 U/L (ref 0–44)
ANION GAP: 10 (ref 5–15)
AST: 18 U/L (ref 15–41)
Alkaline Phosphatase: 49 U/L (ref 38–126)
BILIRUBIN TOTAL: 0.9 mg/dL (ref 0.3–1.2)
BUN: 11 mg/dL (ref 6–20)
CO2: 21 mmol/L — ABNORMAL LOW (ref 22–32)
Calcium: 9.1 mg/dL (ref 8.9–10.3)
Chloride: 108 mmol/L (ref 98–111)
Creatinine, Ser: 0.61 mg/dL (ref 0.44–1.00)
GFR calc Af Amer: >60 mL/min (ref >60)
GFR calc non Af Amer: >60 mL/min (ref >60)
GLUCOSE: 114 mg/dL — AB (ref 70–99)
POTASSIUM: 3.7 mmol/L (ref 3.5–5.1)
Sodium: 139 mmol/L (ref 135–145)
TOTAL PROTEIN: 7.5 g/dL (ref 6.5–8.1)

## 2018-02-14 LAB — I-STAT BETA HCG BLOOD, ED (MC, WL, AP ONLY): I-stat hCG, quantitative: <5 m[IU]/mL (ref <5)

## 2018-02-14 LAB — LIPASE, BLOOD: LIPASE: 27 U/L (ref 11–51)

## 2018-02-14 MED ORDER — DICYCLOMINE HCL 10 MG/ML IM SOLN
20.0000 mg | Freq: Once | INTRAMUSCULAR | Status: AC
  Administered 2018-02-14: 20 mg via INTRAMUSCULAR
  Filled 2018-02-14: qty 2

## 2018-02-14 MED ORDER — PANTOPRAZOLE SODIUM 20 MG PO TBEC: 20.0000 mg | DELAYED_RELEASE_TABLET | Freq: Every day | ORAL | 0 refills | Status: AC

## 2018-02-14 MED ORDER — ONDANSETRON HCL 4 MG/2ML IJ SOLN
4.0000 mg | Freq: Once | INTRAMUSCULAR | Status: AC
  Administered 2018-02-14: 4 mg via INTRAVENOUS
  Filled 2018-02-14: qty 2

## 2018-02-14 MED ORDER — SODIUM CHLORIDE 0.9 % IV BOLUS
1000.0000 mL | Freq: Once | INTRAVENOUS | Status: AC
  Administered 2018-02-14: 1000 mL via INTRAVENOUS

## 2018-02-14 MED ORDER — LIDOCAINE VISCOUS HCL 2 % MT SOLN
15.0000 mL | Freq: Once | OROMUCOSAL | Status: AC
  Administered 2018-02-14: 15 mL via ORAL
  Filled 2018-02-14: qty 15

## 2018-02-14 MED ORDER — PANTOPRAZOLE SODIUM 40 MG IV SOLR
40.0000 mg | Freq: Once | INTRAVENOUS | Status: AC
  Administered 2018-02-14: 40 mg via INTRAVENOUS
  Filled 2018-02-14: qty 40

## 2018-02-14 MED ORDER — ONDANSETRON 4 MG PO TBDP: 4.0000 mg | ORAL_TABLET | Freq: Four times a day (QID) | ORAL | 0 refills | Status: AC | PRN

## 2018-02-14 MED ORDER — DICYCLOMINE HCL 20 MG PO TABS: 20.0000 mg | ORAL_TABLET | Freq: Three times a day (TID) | ORAL | 0 refills | Status: AC | PRN

## 2018-02-14 MED ORDER — ALUM & MAG HYDROXIDE-SIMETH 200-200-20 MG/5ML PO SUSP
30.0000 mL | Freq: Once | ORAL | Status: AC
  Administered 2018-02-14: 30 mL via ORAL
  Filled 2018-02-14: qty 30

## 2018-02-14 NOTE — Discharge Instructions (Signed)
It was my pleasure taking care of you today!   Fortunately, your lab work and urine was reassuring.  Zofran as needed for nausea. Bentyl as needed for abdominal pain. Protonix to take daily for your reflux. At this time, there does not appear to be an acute, emergent cause for your abdominal pain. However, this does not mean that your abdominal pain may not become an emergency in the future. It is VERY important that you monitor your symptoms and return to the Emergency Department if you develop any of the following symptoms:  You have a fever.  You keep throwing up and can't keep fluids down. You pass bloody or black tarry stools.  There is bright red blood in the stool. New or worsening symptoms develop. You have any questions or concerns.

## 2018-02-14 NOTE — ED Triage Notes (Signed)
Pt reports having abdominal pain and vomiting that started around midnight.

## 2018-02-14 NOTE — ED Provider Notes (Signed)
[facility]-EMERGENCY DEPT Provider Note   CSN: [id number] Arrival date & time: 02/14/18  0507     History   Chief Complaint Chief Complaint  Patient presents with  . Abdominal Pain    HPI Nancy Vaughan is a 23 y.o. female.  The history is provided by the patient and medical records. No language interpreter was used.  Abdominal Pain   Associated symptoms include nausea and vomiting.   Nancy Vaughan is a 23 y.o. female  with a PMH of GERD who presents to the Emergency Department complaining of upper abdominal pain which began around midnight tonight.  Associated with nausea and about 5 or 6 episodes of emesis.  History of similar in the past which she has been told was due to her reflux.  She does not know where her reflux medicine is.  No medications taken prior to arrival for symptoms.  She did consume multiple alcoholic beverages tonight.  She denies any sick contacts or fevers.  No urinary symptoms, vaginal discharge, chest pain, shortness of breath or back pain.    Past Medical History:  Diagnosis Date  . Dehydration 01/28/2016  . Environmental allergies   . Gastric ulcer dx'd 2016  . GERD (gastroesophageal reflux disease)   . Migraine    "a few/year" (01/28/2016)    Patient Active Problem List   Diagnosis Date Noted  . Epigastric abdominal pain   . Non-intractable cyclical vomiting with nausea   . Dehydration 01/28/2016    Class: Acute  . Abdominal pain 01/28/2016    Past Surgical History:  Procedure Laterality Date  . ESOPHAGOGASTRODUODENOSCOPY N/A 01/30/2016   Procedure: ESOPHAGOGASTRODUODENOSCOPY (EGD);  Surgeon: Napoleon Form, MD;  Location: [facility] ENDOSCOPY;  Service: Endoscopy;  Laterality: N/A;  . NO PAST SURGERIES       OB History   No obstetric history on file.      Home Medications    Prior to Admission medications   Medication Sig Start Date End Date Taking? Authorizing Provider  EPIPEN 2-PAK 0.3 MG/0.3ML SOAJ injection  Inject 1 application into the skin as needed (for allergic reaciton).  08/25/14  Yes [provider]  loratadine (CLARITIN) 10 MG tablet Take 10 mg by mouth daily.   Yes [provider]  dicyclomine (BENTYL) 20 MG tablet Take 1 tablet (20 mg total) by mouth every 8 (eight) hours as needed for spasms (Abdominal cramping). 02/14/18   Nancy Vaughan, Chase Picket, PA-C  ondansetron (ZOFRAN ODT) 4 MG disintegrating tablet Take 1 tablet (4 mg total) by mouth every 6 (six) hours as needed for nausea or vomiting. 02/14/18   Alesi Zachery, Chase Picket, PA-C  pantoprazole (PROTONIX) 20 MG tablet Take 1 tablet (20 mg total) by mouth daily. 02/14/18   Nancy Vaughan, Chase Picket, PA-C    Family History History reviewed. No pertinent family history.  Social History Social History   Tobacco Use  . Smoking status: Never Smoker  . Smokeless tobacco: Never Used  Substance Use Topics  . Alcohol use: No  . Drug use: No     Allergies   Apple; Peanuts [peanut oil]; Nsaids; and Shrimp [shellfish allergy]   Review of Systems Review of Systems  Gastrointestinal: Positive for abdominal pain, nausea and vomiting.  All other systems reviewed and are negative.    Physical Exam Updated Vital Signs BP (!) 107/44   Pulse 77   Temp 98.6 F (37 C) (Oral)   Resp 18   Ht 5\' 3"  (1.6 m)   Wt 48.5  kg   LMP 02/12/2018   SpO2 100%   BMI 18.95 kg/m   Physical Exam Vitals signs and nursing note reviewed.  Constitutional:      General: She is not in acute distress.    Appearance: She is well-developed.  HENT:     Head: Normocephalic and atraumatic.  Cardiovascular:     Rate and Rhythm: Normal rate and regular rhythm.     Heart sounds: Normal heart sounds. No murmur.  Pulmonary:     Effort: Pulmonary effort is normal. No respiratory distress.     Breath sounds: Normal breath sounds.  Abdominal:     General: There is no distension.     Palpations: Abdomen is soft.     Comments: Tenderness to palpation to the  epigastrium.  Negative Murphy's.  No CVA tenderness.  Skin:    General: Skin is warm and dry.  Neurological:     Mental Status: She is alert and oriented to person, place, and time.      ED Treatments / Results  Labs (all labs ordered are listed, but only abnormal results are displayed) Labs Reviewed  COMPREHENSIVE METABOLIC PANEL - Abnormal; Notable for the following components:      Result Value   CO2 21 (*)    Glucose, Bld 114 (*)    All other components within normal limits  CBC - Abnormal; Notable for the following components:   WBC 13.0 (*)    Hemoglobin 11.6 (*)    All other components within normal limits  URINALYSIS, ROUTINE W REFLEX MICROSCOPIC - Abnormal; Notable for the following components:   Hgb urine dipstick MODERATE (*)    Ketones, ur 20 (*)    Bacteria, UA RARE (*)    All other components within normal limits  LIPASE, BLOOD  I-STAT BETA HCG BLOOD, ED ([facility], [facility], [facility] ONLY)    EKG None  Radiology No results found.  Procedures Procedures (including critical care time)  Medications Ordered in ED Medications  sodium chloride 0.9 % bolus 1,000 mL (0 mLs Intravenous Stopped 02/14/18 0921)  ondansetron (ZOFRAN) injection 4 mg (4 mg Intravenous Given 02/14/18 0745)  pantoprazole (PROTONIX) injection 40 mg (40 mg Intravenous Given 02/14/18 0738)  dicyclomine (BENTYL) injection 20 mg (20 mg Intramuscular Given 02/14/18 0736)  sodium chloride 0.9 % bolus 1,000 mL (0 mLs Intravenous Stopped 02/14/18 1114)  alum & mag hydroxide-simeth (MAALOX/MYLANTA) 200-200-20 MG/5ML suspension 30 mL (30 mLs Oral Given 02/14/18 0945)    And  lidocaine (XYLOCAINE) 2 % viscous mouth solution 15 mL (15 mLs Oral Given 02/14/18 0945)     Initial Impression / Assessment and Plan / ED Course  I have reviewed the triage vital signs and the nursing notes.  Pertinent labs & imaging results that were available during my care of the patient were reviewed by me and considered in my medical decision  making (see chart for details).    Nancy Vaughan is a 23 y.o. female who presents to ED for upper abdominal pain, n/v. Patient is nontoxic, nonseptic appearing with a non-surgical abdominal exam. Patient's pain and other symptoms have been adequately managed in the Emergency Department today. Fluid bolus given.  Labs, imaging and vitals reviewed. Patient does not meet the SIRS or Sepsis criteria. On repeat exam, abdominal exam much improved after symptomatic management with no peritoneal signs. She is tolerating PO. No indication of appendicitis, bowel obstruction, bowel perforation, cholecystitis, diverticulitis, PID or ectopic pregnancy. Patient discharged home with symptomatic treatment and encouraged to  follow up with PCP. I have also discussed reasons to return immediately to the ER. Patient expresses understanding and agrees with plan as dictated above.   Final Clinical Impressions(s) / ED Diagnoses   Final diagnoses:  Generalized abdominal pain    ED Discharge Orders         Ordered    dicyclomine (BENTYL) 20 MG tablet  Every 8 hours PRN     02/14/18 1119    ondansetron (ZOFRAN ODT) 4 MG disintegrating tablet  Every 6 hours PRN     02/14/18 1119    pantoprazole (PROTONIX) 20 MG tablet  Daily     02/14/18 1119           Kosei Rhodes, Chase Picket, PA-C 02/14/18 1122    Donnetta Hutching, MD 02/15/18 [phone]

## 2018-02-14 NOTE — ED Notes (Signed)
Pt cannot use restroom at this time, aware urine specimen is needed.  

## 2018-07-19 IMAGING — DX DG ABDOMEN ACUTE W/ 1V CHEST
3 series · 3 of 3 positions shown · non-contrast
Comparison: Abdominal CT dated 07/20/2014

CLINICAL DATA: 19-year-old female with abdominal pain, nausea and
vomiting.

EXAM:
DG ABDOMEN ACUTE W/ 1V CHEST

[chest pa]
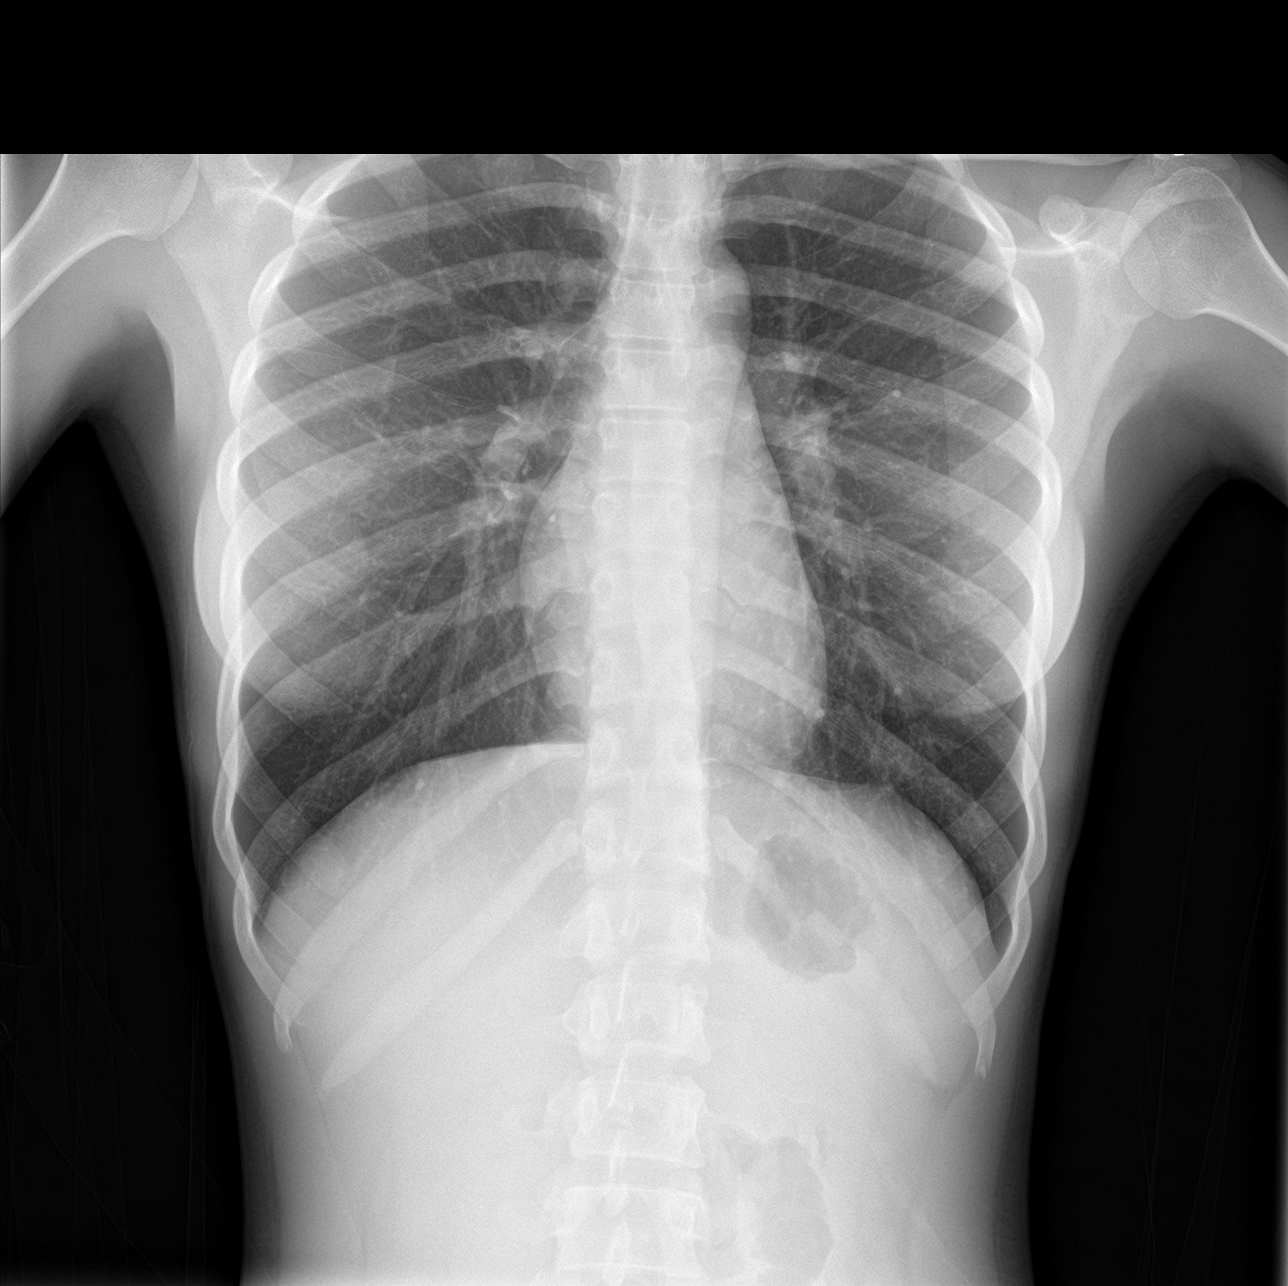

[abdomen erect]
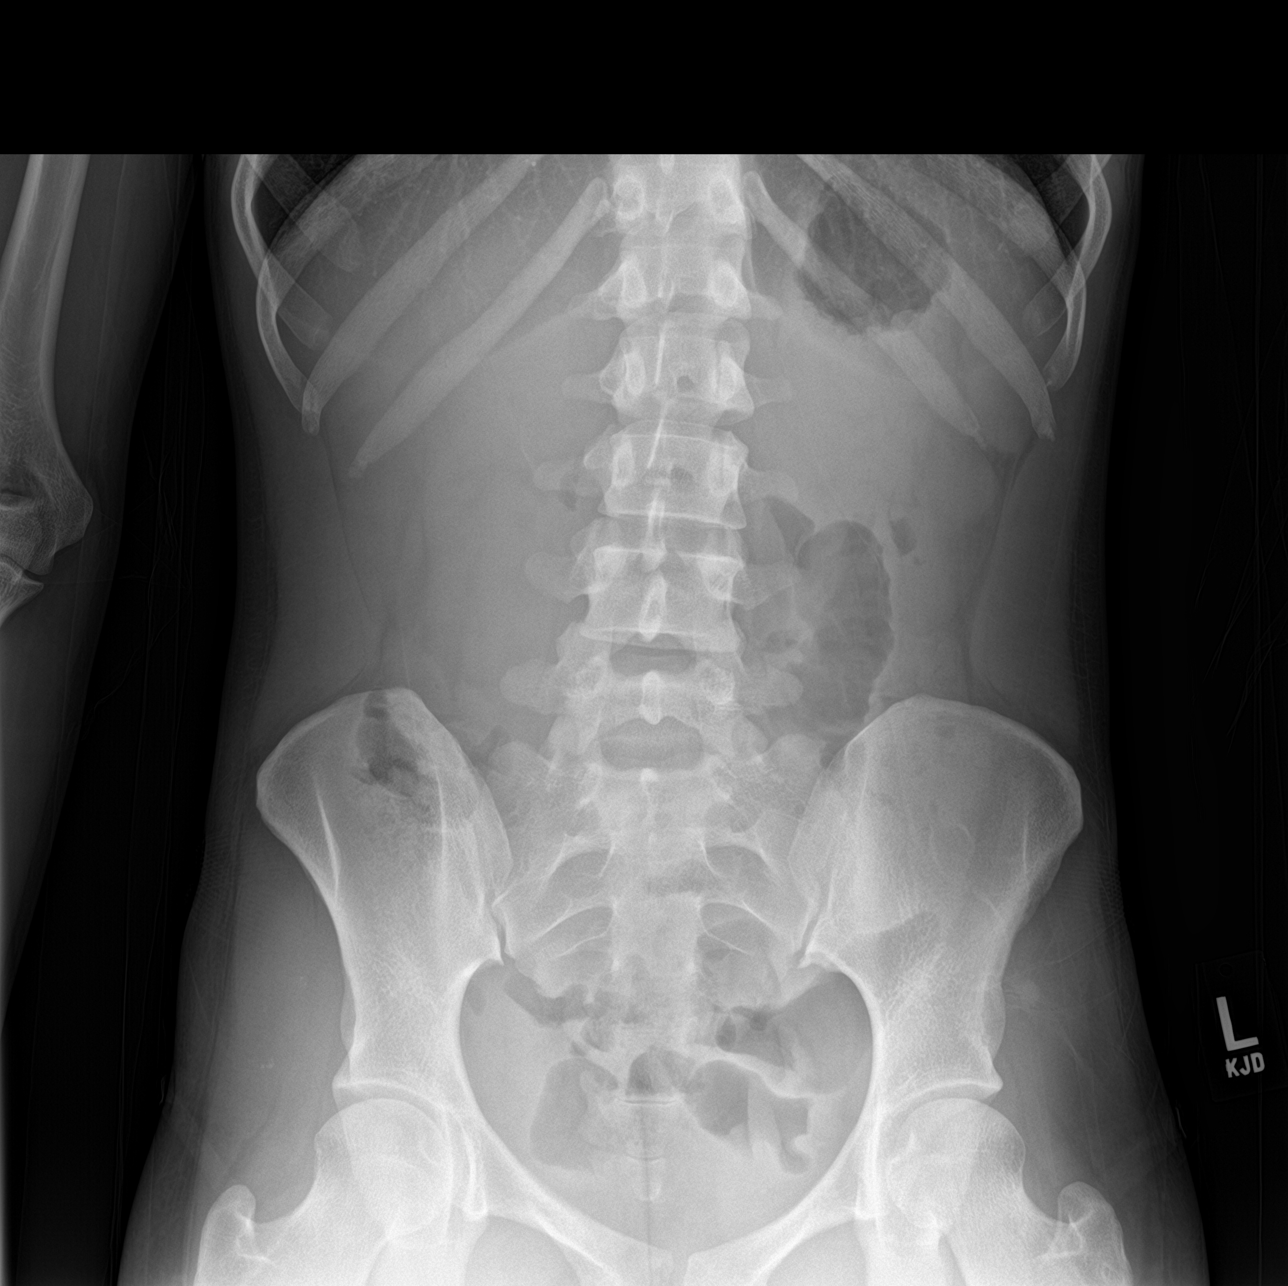

[abdomen supine]
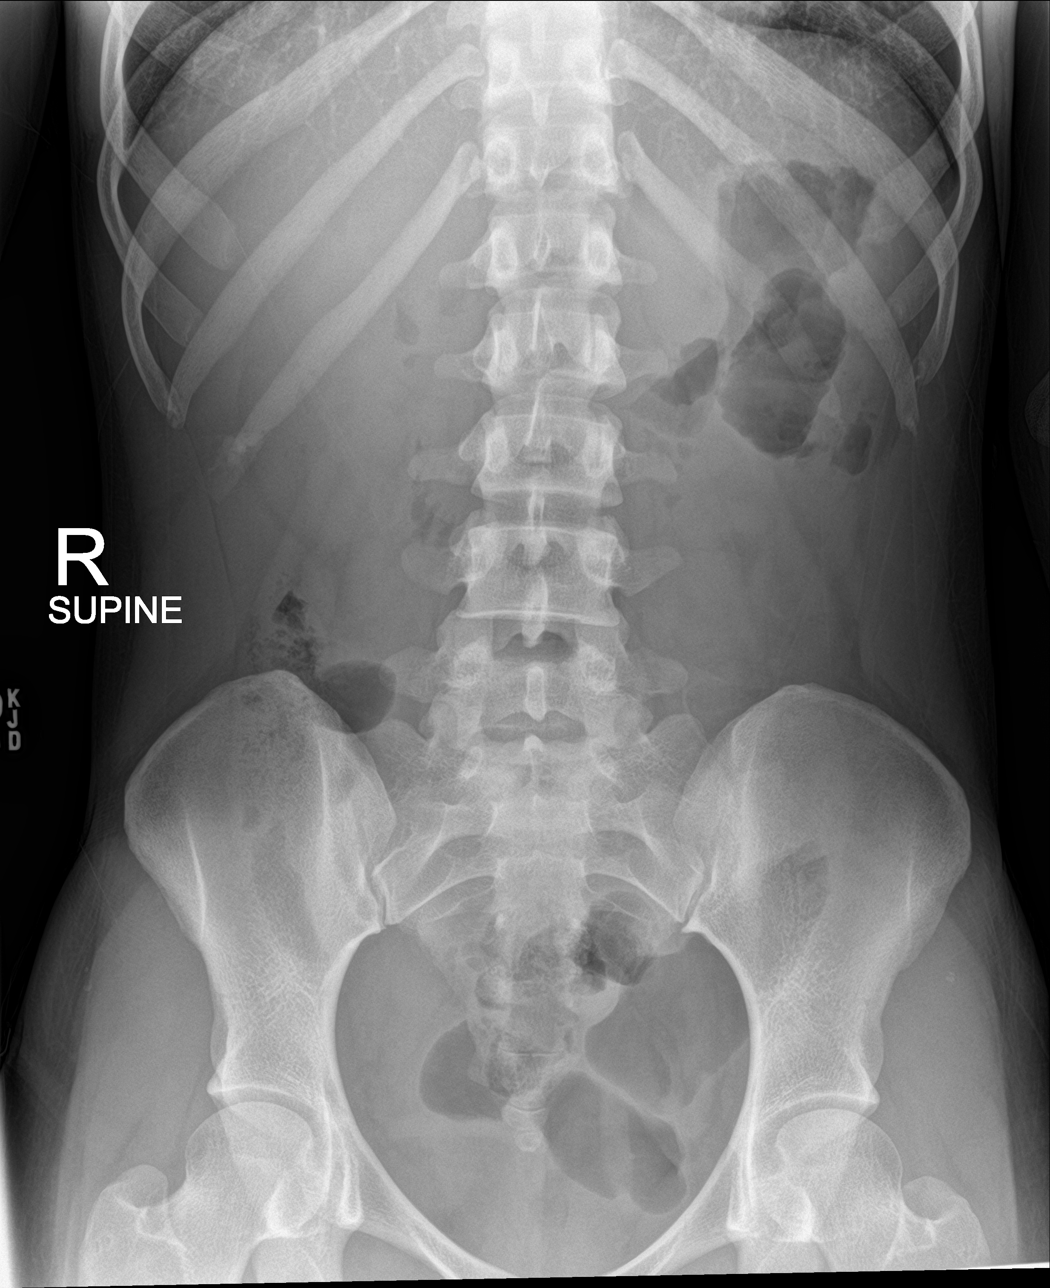

[3 of 3 positions shown; findings below may reference images not displayed]

FINDINGS: There is no evidence of dilated bowel loops or free intraperitoneal
air. No radiopaque calculi or other significant radiographic
abnormality is seen. Heart size and mediastinal contours are within
normal limits. Both lungs are clear.
IMPRESSION: Negative abdominal radiographs.  No acute cardiopulmonary disease.

## 2019-05-29 ENCOUNTER — Ambulatory Visit: Payer: Managed Care, Other (non HMO) | Attending: Internal Medicine

## 2019-05-29 DIAGNOSIS — Z20822 Contact with and (suspected) exposure to covid-19: Secondary | ICD-10-CM

## 2019-05-30 LAB — SARS-COV-2, NAA 2 DAY TAT

## 2019-05-30 LAB — NOVEL CORONAVIRUS, NAA: SARS-CoV-2, NAA: NOT DETECTED

## 2019-10-01 ENCOUNTER — Other Ambulatory Visit: Payer: Self-pay

## 2019-10-01 ENCOUNTER — Ambulatory Visit (LOCAL_COMMUNITY_HEALTH_CENTER): Payer: Medicaid Other

## 2019-10-01 DIAGNOSIS — Z111 Encounter for screening for respiratory tuberculosis: Secondary | ICD-10-CM

## 2019-10-01 NOTE — Progress Notes (Signed)
Patient had PPD placed on 09/27/19 and read on 09/30/19 as a questionable positive.  Employer contacted TB coordinator and requested [facility] to f/u with patient. TB RN unable to measure PPD d/t induration only on one side.  Patient has no known risk factors for TB.    TB coordinator placed a PPD today and will read at 48 hours on 10/03/19 Richmond Campbell, RN

## 2019-10-03 ENCOUNTER — Other Ambulatory Visit: Payer: Self-pay

## 2019-10-03 ENCOUNTER — Ambulatory Visit (LOCAL_COMMUNITY_HEALTH_CENTER): Payer: Medicaid Other

## 2019-10-03 DIAGNOSIS — Z111 Encounter for screening for respiratory tuberculosis: Secondary | ICD-10-CM

## 2019-10-03 LAB — TB SKIN TEST
Induration: 0 mm
TB Skin Test: NEGATIVE

## 2019-10-03 NOTE — Progress Notes (Signed)
Patient with redness at injection site but no induration. Richmond Campbell, RN

## 2020-06-15 ENCOUNTER — Emergency Department (HOSPITAL_COMMUNITY)
Admission: EM | Admit: 2020-06-15 | Discharge: 2020-06-15 | Disposition: A | Discharge status: Home / Self Care | Payer: BC Managed Care – PPO | Attending: Emergency Medicine | Admitting: Emergency Medicine

## 2020-06-15 DIAGNOSIS — Z91010 Allergy to peanuts: Secondary | ICD-10-CM | POA: Insufficient documentation

## 2020-06-15 DIAGNOSIS — Y92410 Unspecified street and highway as the place of occurrence of the external cause: Secondary | ICD-10-CM | POA: Diagnosis not present

## 2020-06-15 DIAGNOSIS — M25512 Pain in left shoulder: Secondary | ICD-10-CM | POA: Insufficient documentation

## 2020-06-15 DIAGNOSIS — M7918 Myalgia, other site: Secondary | ICD-10-CM

## 2020-06-15 MED ORDER — CYCLOBENZAPRINE HCL 10 MG PO TABS: 10.0000 mg | ORAL_TABLET | Freq: Two times a day (BID) | ORAL | 0 refills | Status: AC | PRN
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
## 2020-06-15 NOTE — Discharge Instructions (Addendum)
Motrin and Tylenol as needed as directed.  Prescription for Flexeril sent to pharmacy if needed for muscle spasms. Warm compresses to sore muscles for 20 minutes at a time followed by gentle stretching.  Recheck with your doctor if pain is not improving after 3 to 5 days.

## 2023-10-02 ENCOUNTER — Inpatient Hospital Stay (HOSPITAL_COMMUNITY)
Admission: AD | Admit: 2023-10-02 | Discharge: 2023-10-02 | Disposition: A | Discharge status: Home / Self Care | Attending: Obstetrics and Gynecology | Admitting: Obstetrics and Gynecology

## 2023-10-02 ENCOUNTER — Encounter (HOSPITAL_COMMUNITY): Payer: Self-pay | Admitting: Obstetrics and Gynecology

## 2023-10-02 ENCOUNTER — Other Ambulatory Visit: Payer: Self-pay

## 2023-10-02 DIAGNOSIS — Z3A38 38 weeks gestation of pregnancy: Secondary | ICD-10-CM

## 2023-10-02 DIAGNOSIS — S63657A Sprain of metacarpophalangeal joint of left little finger, initial encounter: Secondary | ICD-10-CM | POA: Diagnosis not present

## 2023-10-02 DIAGNOSIS — O26893 Other specified pregnancy related conditions, third trimester: Secondary | ICD-10-CM

## 2023-10-02 DIAGNOSIS — R55 Syncope and collapse: Secondary | ICD-10-CM | POA: Diagnosis not present

## 2023-10-02 DIAGNOSIS — O26813 Pregnancy related exhaustion and fatigue, third trimester: Secondary | ICD-10-CM | POA: Diagnosis present

## 2023-10-02 DIAGNOSIS — W19XXXA Unspecified fall, initial encounter: Secondary | ICD-10-CM | POA: Insufficient documentation

## 2023-10-02 LAB — URINALYSIS, ROUTINE W REFLEX MICROSCOPIC
Bilirubin Urine: NEGATIVE
Glucose, UA: NEGATIVE mg/dL
Hgb urine dipstick: NEGATIVE
Ketones, ur: 5 mg/dL — AB
Leukocytes,Ua: NEGATIVE
Nitrite: NEGATIVE
Protein, ur: NEGATIVE mg/dL
Specific Gravity, Urine: 1.013 (ref 1.005–1.030)
pH: 7 (ref 5.0–8.0)
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
## 2023-10-02 NOTE — MAU Note (Signed)
 Pt says at 530pm--was called to work at [employer]  - all ok - until At 7pm- she became overheated and left the room- to next studio- says she fell down - onto a chair - that broke her fall and then passed out - this is what manager says happened.  Said she did not hit her head . Has had heat flashes before- but never passed out . Now- feels ok. Head doesn't hurt - no UC's . [facility] at [facility]
